# Patient Record
Sex: Female | Born: 1960 | Race: Black or African American | Hispanic: No | Marital: Married | Smoking: Never smoker
Health system: Southern US, Community
[De-identification: ages and names within clinical notes are randomized; demographics above are authoritative.]

## PROBLEM LIST (undated history)

## (undated) DIAGNOSIS — I10 Essential (primary) hypertension: Secondary | ICD-10-CM

## (undated) DIAGNOSIS — N750 Cyst of Bartholin's gland: Secondary | ICD-10-CM

## (undated) DIAGNOSIS — Z87440 Personal history of urinary (tract) infections: Secondary | ICD-10-CM

## (undated) DIAGNOSIS — D259 Leiomyoma of uterus, unspecified: Secondary | ICD-10-CM

## (undated) DIAGNOSIS — K649 Unspecified hemorrhoids: Secondary | ICD-10-CM

## (undated) DIAGNOSIS — A419 Sepsis, unspecified organism: Secondary | ICD-10-CM

## (undated) DIAGNOSIS — Z8719 Personal history of other diseases of the digestive system: Secondary | ICD-10-CM

## (undated) HISTORY — DX: Leiomyoma of uterus, unspecified: D25.9

## (undated) HISTORY — PX: DIAGNOSTIC LAPAROSCOPY: SUR761

## (undated) HISTORY — DX: Cyst of Bartholin's gland: N75.0

## (undated) HISTORY — PX: APPENDECTOMY: SHX54

## (undated) HISTORY — DX: Unspecified hemorrhoids: K64.9

## (undated) HISTORY — DX: Essential (primary) hypertension: I10

## (undated) HISTORY — DX: Sepsis, unspecified organism: A41.9

---

## 2003-08-05 HISTORY — PX: ABDOMINAL HYSTERECTOMY: SHX81

## 2011-08-09 DIAGNOSIS — IMO0001 Reserved for inherently not codable concepts without codable children: Secondary | ICD-10-CM | POA: Insufficient documentation

## 2011-08-09 DIAGNOSIS — I132 Hypertensive heart and chronic kidney disease with heart failure and with stage 5 chronic kidney disease, or end stage renal disease: Secondary | ICD-10-CM | POA: Insufficient documentation

## 2012-07-22 DIAGNOSIS — L930 Discoid lupus erythematosus: Secondary | ICD-10-CM | POA: Insufficient documentation

## 2012-07-22 DIAGNOSIS — D219 Benign neoplasm of connective and other soft tissue, unspecified: Secondary | ICD-10-CM | POA: Insufficient documentation

## 2012-07-22 DIAGNOSIS — G5600 Carpal tunnel syndrome, unspecified upper limb: Secondary | ICD-10-CM | POA: Insufficient documentation

## 2012-07-22 DIAGNOSIS — K449 Diaphragmatic hernia without obstruction or gangrene: Secondary | ICD-10-CM | POA: Insufficient documentation

## 2012-12-20 DIAGNOSIS — J302 Other seasonal allergic rhinitis: Secondary | ICD-10-CM | POA: Insufficient documentation

## 2013-10-24 ENCOUNTER — Ambulatory Visit (INDEPENDENT_AMBULATORY_CARE_PROVIDER_SITE_OTHER): Payer: BC Managed Care – PPO | Admitting: Sports Medicine

## 2013-10-24 ENCOUNTER — Encounter: Payer: Self-pay | Admitting: Sports Medicine

## 2013-10-24 VITALS — BP 156/90 | HR 63 | Ht 66.0 in | Wt 188.0 lb

## 2013-10-24 DIAGNOSIS — M2240 Chondromalacia patellae, unspecified knee: Secondary | ICD-10-CM | POA: Insufficient documentation

## 2013-10-24 DIAGNOSIS — S86899A Other injury of other muscle(s) and tendon(s) at lower leg level, unspecified leg, initial encounter: Secondary | ICD-10-CM | POA: Insufficient documentation

## 2013-10-24 DIAGNOSIS — M25562 Pain in left knee: Secondary | ICD-10-CM

## 2013-10-24 DIAGNOSIS — IMO0002 Reserved for concepts with insufficient information to code with codable children: Secondary | ICD-10-CM

## 2013-10-24 MED ORDER — MELOXICAM 15 MG PO TABS: ORAL_TABLET | ORAL | Status: DC

## 2013-10-24 NOTE — Progress Notes (Signed)
   Subjective:    I'm seeing this patient as a consultation for:  Dr. Jenny Reichmann Card  CC: Left knee pain, left shin pain  HPI: Patient is a pleasant 53 year old who presents with left knee pain since last December. She started running 6 months ago. During that she starting having shin splints in her left leg. After a 9 mile run at the end of last December she had sharp pain in the medial left knee with getting up and walking up stairs. Two weeks later she was seen and she got x-rays and was started on NSAIDs. She also went for one session of physical therapy. With no relief after a month, she got a steroid injection two weeks ago and had an MRI. The steroid injection relieved some of her pain after 4 days, but she is still unable to run or exercise. Complains of crepitus in both knees, however denies any mechanical symptoms like popping or locking. Her main issue is that no one has explained her symptoms or biomechanics or issues to her very well  Past medical history, Surgical history, Family history not pertinant except as noted below, Social history, Allergies, and medications have been entered into the medical record, reviewed, and no changes needed.   Review of Systems: No headache, visual changes, nausea, vomiting, diarrhea, constipation, dizziness, abdominal pain, skin rash, fevers, chills, night sweats, weight loss, swollen lymph nodes, body aches, joint swelling, muscle aches, chest pain, shortness of breath, mood changes, visual or auditory hallucinations.   Objective:   General: Well Developed, well nourished, and in no acute distress.  Neuro/Psych: Alert and oriented x3, extra-ocular muscles intact, able to move all 4 extremities, sensation grossly intact. Skin: Warm and dry, no rashes noted.  Respiratory: Not using accessory muscles, speaking in full sentences, trachea midline.  Cardiovascular: Pulses palpable, no extremity edema. Abdomen: Does not appear distended. Left Knee: Tender to  palpation over medial joint line. Negative anterior and posterior drawer test. Left Leg: Tender to palpation over anterior tibia. Weakness in hip abduction.  Impression and Recommendations:   This case required medical decision making of moderate complexity.

## 2013-10-24 NOTE — Assessment & Plan Note (Addendum)
Had an injection 2 weeks ago at [place], pain-free. We will work extensively on physical therapy and rehabilitation. She will bring me a disc with her x-ray and MRI report.

## 2013-10-24 NOTE — Assessment & Plan Note (Signed)
Profound left-sided hip abductor weakness. Formal physical therapy working extensively on hip abductors. Return to see me in 6 weeks.

## 2013-10-25 ENCOUNTER — Encounter: Payer: Self-pay | Admitting: Sports Medicine

## 2013-11-03 ENCOUNTER — Ambulatory Visit (INDEPENDENT_AMBULATORY_CARE_PROVIDER_SITE_OTHER): Payer: BC Managed Care – PPO

## 2013-11-03 ENCOUNTER — Ambulatory Visit: Payer: BC Managed Care – PPO

## 2013-11-03 DIAGNOSIS — M25569 Pain in unspecified knee: Secondary | ICD-10-CM

## 2013-11-03 DIAGNOSIS — IMO0002 Reserved for concepts with insufficient information to code with codable children: Secondary | ICD-10-CM

## 2013-11-03 DIAGNOSIS — M6281 Muscle weakness (generalized): Secondary | ICD-10-CM

## 2013-11-03 DIAGNOSIS — R609 Edema, unspecified: Secondary | ICD-10-CM

## 2013-11-15 ENCOUNTER — Telehealth: Payer: Self-pay | Admitting: Sports Medicine

## 2013-11-17 ENCOUNTER — Encounter (INDEPENDENT_AMBULATORY_CARE_PROVIDER_SITE_OTHER): Payer: BC Managed Care – PPO

## 2013-11-17 DIAGNOSIS — M25569 Pain in unspecified knee: Secondary | ICD-10-CM

## 2013-11-17 DIAGNOSIS — R609 Edema, unspecified: Secondary | ICD-10-CM

## 2013-11-17 DIAGNOSIS — IMO0002 Reserved for concepts with insufficient information to code with codable children: Secondary | ICD-10-CM

## 2013-11-17 DIAGNOSIS — M6281 Muscle weakness (generalized): Secondary | ICD-10-CM

## 2013-11-22 ENCOUNTER — Encounter: Payer: Self-pay | Admitting: Sports Medicine

## 2013-12-06 ENCOUNTER — Ambulatory Visit (INDEPENDENT_AMBULATORY_CARE_PROVIDER_SITE_OTHER): Payer: BC Managed Care – PPO | Admitting: Sports Medicine

## 2013-12-06 ENCOUNTER — Encounter: Payer: Self-pay | Admitting: Sports Medicine

## 2013-12-06 VITALS — BP 144/83 | HR 70 | Ht 66.0 in | Wt 188.0 lb

## 2013-12-06 DIAGNOSIS — K219 Gastro-esophageal reflux disease without esophagitis: Secondary | ICD-10-CM | POA: Insufficient documentation

## 2013-12-06 DIAGNOSIS — S86899A Other injury of other muscle(s) and tendon(s) at lower leg level, unspecified leg, initial encounter: Secondary | ICD-10-CM

## 2013-12-06 DIAGNOSIS — M25569 Pain in unspecified knee: Secondary | ICD-10-CM | POA: Insufficient documentation

## 2013-12-06 DIAGNOSIS — IMO0002 Reserved for concepts with insufficient information to code with codable children: Secondary | ICD-10-CM

## 2013-12-06 NOTE — Progress Notes (Signed)
  Subjective:    CC: Followup  HPI: Medial tibial stress syndrome: Improved significantly with hip abductor rehabilitation, she does not desire custom orthotics. Pain is mild, continues to improve.  Past medical history, Surgical history, Family history not pertinant except as noted below, Social history, Allergies, and medications have been entered into the medical record, reviewed, and no changes needed.   Review of Systems: No fevers, chills, night sweats, weight loss, chest pain, or shortness of breath.   Objective:    General: Well Developed, well nourished, and in no acute distress.  Neuro: Alert and oriented x3, extra-ocular muscles intact, sensation grossly intact.  HEENT: Normocephalic, atraumatic, pupils equal round reactive to light, neck supple, no masses, no lymphadenopathy, thyroid nonpalpable.  Skin: Warm and dry, no rashes. Cardiac: Regular rate and rhythm, no murmurs rubs or gallops, no lower extremity edema.  Respiratory: Clear to auscultation bilaterally. Not using accessory muscles, speaking in full sentences. Left Hip abductor strength is improved but still not sufficient.  I taught her some hip abductor rehabilitation exercises in the office. She was given some green theraband.  Impression and Recommendations:

## 2013-12-06 NOTE — Patient Instructions (Signed)
Hip Rehabilitation Protocol:  1.  Side leg raises.  3x30 with no weight, then 3x15 with 2 lb ankle weight, then 3x15 with 5 lb ankle weight 

## 2013-12-06 NOTE — Assessment & Plan Note (Signed)
Improved significantly, continue to work on hip abductor's, they were better but not great. Return for custom orthotics.

## 2013-12-15 ENCOUNTER — Ambulatory Visit (INDEPENDENT_AMBULATORY_CARE_PROVIDER_SITE_OTHER): Payer: BC Managed Care – PPO | Admitting: Sports Medicine

## 2013-12-15 ENCOUNTER — Encounter: Payer: Self-pay | Admitting: Sports Medicine

## 2013-12-15 VITALS — BP 147/83 | HR 73 | Ht 66.0 in | Wt 192.0 lb

## 2013-12-15 DIAGNOSIS — IMO0002 Reserved for concepts with insufficient information to code with codable children: Secondary | ICD-10-CM

## 2013-12-15 DIAGNOSIS — S86899A Other injury of other muscle(s) and tendon(s) at lower leg level, unspecified leg, initial encounter: Secondary | ICD-10-CM

## 2013-12-15 NOTE — Assessment & Plan Note (Signed)
Custom orthotics as above. Continue hip abductor rehabilitation, return in one month.

## 2013-12-15 NOTE — Progress Notes (Signed)
    Patient was fitted for a : standard, cushioned, semi-rigid orthotic. The orthotic was heated and afterward the patient stood on the orthotic blank positioned on the orthotic stand. The patient was positioned in subtalar neutral position and 10 degrees of ankle dorsiflexion in a weight bearing stance. After completion of molding, a stable base was applied to the orthotic blank. The blank was ground to a stable position for weight bearing. Size: 9 Base: Blue EVA Additional Posting and Padding: None The patient ambulated these, and they were very comfortable.  I spent 40 minutes with this patient, greater than 50% was face-to-face time counseling regarding the below diagnosis.

## 2014-01-16 ENCOUNTER — Ambulatory Visit (INDEPENDENT_AMBULATORY_CARE_PROVIDER_SITE_OTHER): Payer: BC Managed Care – PPO | Admitting: Sports Medicine

## 2014-01-16 ENCOUNTER — Ambulatory Visit: Payer: BC Managed Care – PPO | Admitting: Family Medicine

## 2014-01-16 ENCOUNTER — Encounter: Payer: Self-pay | Admitting: Sports Medicine

## 2014-01-16 VITALS — BP 153/88 | HR 86 | Ht 66.0 in | Wt 190.0 lb

## 2014-01-16 DIAGNOSIS — M5412 Radiculopathy, cervical region: Secondary | ICD-10-CM | POA: Insufficient documentation

## 2014-01-16 DIAGNOSIS — S86899A Other injury of other muscle(s) and tendon(s) at lower leg level, unspecified leg, initial encounter: Secondary | ICD-10-CM

## 2014-01-16 DIAGNOSIS — M25562 Pain in left knee: Secondary | ICD-10-CM

## 2014-01-16 MED ORDER — METAXALONE 800 MG PO TABS: 800.0000 mg | ORAL_TABLET | Freq: Three times a day (TID) | ORAL | Status: DC

## 2014-01-16 MED ORDER — MELOXICAM 15 MG PO TABS: ORAL_TABLET | ORAL | Status: DC

## 2014-01-16 NOTE — Progress Notes (Signed)
  Subjective:    CC: Follow up  HPI: This is a pleasant 53 year-old female, treated her for medial tibial stress syndrome and left knee pain with custom orthotics. Her pain is now resolved. Unfortunately she now has pain she localizes in the cervical spine with radiation down the right arm in a C7 distribution. Moderate, persistent.  Past medical history, Surgical history, Family history not pertinant except as noted below, Social history, Allergies, and medications have been entered into the medical record, reviewed, and no changes needed.   Review of Systems: No fevers, chills, night sweats, weight loss, chest pain, or shortness of breath.   Objective:    General: Well Developed, well nourished, and in no acute distress.  Neuro: Alert and oriented x3, extra-ocular muscles intact, sensation grossly intact.  HEENT: Normocephalic, atraumatic, pupils equal round reactive to light, neck supple, no masses, no lymphadenopathy, thyroid nonpalpable.  Skin: Warm and dry, no rashes. Cardiac: Regular rate and rhythm, no murmurs rubs or gallops, no lower extremity edema.  Respiratory: Clear to auscultation bilaterally. Not using accessory muscles, speaking in full sentences. Neck: Inspection unremarkable. No palpable stepoffs. Negative Spurling's maneuver. Full neck range of motion Grip strength and sensation normal in bilateral hands Strength good C4 to T1 distribution No sensory change to C4 to T1 Negative Hoffman sign bilaterally Reflexes normal  Impression and Recommendations:

## 2014-01-16 NOTE — Assessment & Plan Note (Signed)
Resolved with custom orthotics 

## 2014-01-16 NOTE — Assessment & Plan Note (Signed)
Right-sided, likely C7, prescribed prednisone by another provider. At this point we are going to proceed with an x-ray, formal PT, Skelaxin, Mobic. Return to see me in a month, MRI for interventional ejection planning if no better.

## 2014-01-16 NOTE — Assessment & Plan Note (Signed)
She is having some pain on the lateral side of her right knee with running suggestive of iliotibial band syndrome. Home rehabilitation exercises, return as needed.

## 2014-01-24 ENCOUNTER — Ambulatory Visit (INDEPENDENT_AMBULATORY_CARE_PROVIDER_SITE_OTHER): Payer: BC Managed Care – PPO | Admitting: Physical Therapy

## 2014-01-24 DIAGNOSIS — M6281 Muscle weakness (generalized): Secondary | ICD-10-CM

## 2014-01-24 DIAGNOSIS — M25519 Pain in unspecified shoulder: Secondary | ICD-10-CM

## 2014-01-24 DIAGNOSIS — M5412 Radiculopathy, cervical region: Secondary | ICD-10-CM

## 2014-01-24 DIAGNOSIS — M542 Cervicalgia: Secondary | ICD-10-CM

## 2014-01-30 ENCOUNTER — Encounter (INDEPENDENT_AMBULATORY_CARE_PROVIDER_SITE_OTHER): Payer: BC Managed Care – PPO | Admitting: Physical Therapy

## 2014-01-30 DIAGNOSIS — M6281 Muscle weakness (generalized): Secondary | ICD-10-CM

## 2014-01-30 DIAGNOSIS — M25519 Pain in unspecified shoulder: Secondary | ICD-10-CM

## 2014-01-30 DIAGNOSIS — M542 Cervicalgia: Secondary | ICD-10-CM

## 2014-01-30 DIAGNOSIS — M5412 Radiculopathy, cervical region: Secondary | ICD-10-CM

## 2014-02-01 ENCOUNTER — Encounter (INDEPENDENT_AMBULATORY_CARE_PROVIDER_SITE_OTHER): Payer: BC Managed Care – PPO | Admitting: Physical Therapy

## 2014-02-01 DIAGNOSIS — M25519 Pain in unspecified shoulder: Secondary | ICD-10-CM

## 2014-02-01 DIAGNOSIS — M6281 Muscle weakness (generalized): Secondary | ICD-10-CM

## 2014-02-01 DIAGNOSIS — M542 Cervicalgia: Secondary | ICD-10-CM

## 2014-02-01 DIAGNOSIS — M5412 Radiculopathy, cervical region: Secondary | ICD-10-CM

## 2014-02-02 ENCOUNTER — Encounter: Payer: BC Managed Care – PPO | Admitting: Physical Therapy

## 2014-02-06 ENCOUNTER — Encounter (INDEPENDENT_AMBULATORY_CARE_PROVIDER_SITE_OTHER): Payer: BC Managed Care – PPO | Admitting: Physical Therapy

## 2014-02-06 DIAGNOSIS — M5412 Radiculopathy, cervical region: Secondary | ICD-10-CM

## 2014-02-06 DIAGNOSIS — M542 Cervicalgia: Secondary | ICD-10-CM

## 2014-02-06 DIAGNOSIS — M6281 Muscle weakness (generalized): Secondary | ICD-10-CM

## 2014-02-06 DIAGNOSIS — M25519 Pain in unspecified shoulder: Secondary | ICD-10-CM

## 2014-02-09 ENCOUNTER — Encounter: Payer: BC Managed Care – PPO | Admitting: Physical Therapy

## 2014-02-13 ENCOUNTER — Ambulatory Visit (INDEPENDENT_AMBULATORY_CARE_PROVIDER_SITE_OTHER): Payer: BC Managed Care – PPO

## 2014-02-13 ENCOUNTER — Ambulatory Visit (INDEPENDENT_AMBULATORY_CARE_PROVIDER_SITE_OTHER): Payer: BC Managed Care – PPO | Admitting: Sports Medicine

## 2014-02-13 ENCOUNTER — Telehealth: Payer: Self-pay | Admitting: *Deleted

## 2014-02-13 ENCOUNTER — Encounter: Payer: Self-pay | Admitting: Sports Medicine

## 2014-02-13 VITALS — BP 148/83 | HR 63 | Wt 191.0 lb

## 2014-02-13 DIAGNOSIS — M5030 Other cervical disc degeneration, unspecified cervical region: Secondary | ICD-10-CM

## 2014-02-13 DIAGNOSIS — M5412 Radiculopathy, cervical region: Secondary | ICD-10-CM

## 2014-02-13 DIAGNOSIS — M47812 Spondylosis without myelopathy or radiculopathy, cervical region: Secondary | ICD-10-CM

## 2014-02-13 DIAGNOSIS — M4040 Postural lordosis, site unspecified: Secondary | ICD-10-CM

## 2014-02-13 NOTE — Progress Notes (Signed)
  Subjective:    CC: Followup  HPI: This is a pleasant 53 year old female who returns to see me for right-sided cervical radiculopathy, she describes it as radiating down the back side of her arm, to the thumb and forefinger. It is worse when turning her head to the ipsilateral side. Pain is moderate, persistent and significantly not much better after physical therapy, steroids, NSAIDs, and muscle relaxers.  Past medical history, Surgical history, Family history not pertinant except as noted below, Social history, Allergies, and medications have been entered into the medical record, reviewed, and no changes needed.   Review of Systems: No fevers, chills, night sweats, weight loss, chest pain, or shortness of breath.   Objective:    General: Well Developed, well nourished, and in no acute distress.  Neuro: Alert and oriented x3, extra-ocular muscles intact, sensation grossly intact.  HEENT: Normocephalic, atraumatic, pupils equal round reactive to light, neck supple, no masses, no lymphadenopathy, thyroid nonpalpable.  Skin: Warm and dry, no rashes. Cardiac: Regular rate and rhythm, no murmurs rubs or gallops, no lower extremity edema.  Respiratory: Clear to auscultation bilaterally. Not using accessory muscles, speaking in full sentences.  X-rays do show expected C5-C6 degenerative changes.  Impression and Recommendations:

## 2014-02-13 NOTE — Assessment & Plan Note (Signed)
Only slightly improved after physical therapy, steroids, muscle relaxers, and NSAIDs for 6 weeks. MRI cervical spine. This likely represents a C6 versus C7 radiculopathy on the right side. Epidural is the next. Return to see me to go over MRI results

## 2014-02-13 NOTE — Telephone Encounter (Signed)
MRI cervical 17915 approved 02/13/14-03/14/14. Auth [id number] Denman George). Radiology notified. Margette Fast, CMA

## 2014-02-14 ENCOUNTER — Encounter (INDEPENDENT_AMBULATORY_CARE_PROVIDER_SITE_OTHER): Payer: Self-pay

## 2014-02-14 ENCOUNTER — Ambulatory Visit (HOSPITAL_BASED_OUTPATIENT_CLINIC_OR_DEPARTMENT_OTHER)
Admission: RE | Admit: 2014-02-14 | Discharge: 2014-02-14 | Disposition: A | Discharge status: Home / Self Care | Payer: BC Managed Care – PPO | Source: Ambulatory Visit | Attending: Sports Medicine | Admitting: Sports Medicine

## 2014-02-14 DIAGNOSIS — M542 Cervicalgia: Secondary | ICD-10-CM | POA: Insufficient documentation

## 2014-02-14 DIAGNOSIS — M47812 Spondylosis without myelopathy or radiculopathy, cervical region: Secondary | ICD-10-CM | POA: Insufficient documentation

## 2014-02-14 DIAGNOSIS — M5030 Other cervical disc degeneration, unspecified cervical region: Secondary | ICD-10-CM | POA: Insufficient documentation

## 2014-02-14 DIAGNOSIS — M5412 Radiculopathy, cervical region: Secondary | ICD-10-CM

## 2014-02-14 DIAGNOSIS — R209 Unspecified disturbances of skin sensation: Secondary | ICD-10-CM | POA: Insufficient documentation

## 2014-02-14 DIAGNOSIS — M79609 Pain in unspecified limb: Secondary | ICD-10-CM | POA: Insufficient documentation

## 2014-02-17 ENCOUNTER — Encounter: Payer: Self-pay | Admitting: Sports Medicine

## 2014-02-17 ENCOUNTER — Ambulatory Visit (INDEPENDENT_AMBULATORY_CARE_PROVIDER_SITE_OTHER): Payer: BC Managed Care – PPO | Admitting: Sports Medicine

## 2014-02-17 VITALS — BP 151/85 | HR 67 | Wt 191.0 lb

## 2014-02-17 DIAGNOSIS — M5412 Radiculopathy, cervical region: Secondary | ICD-10-CM | POA: Diagnosis not present

## 2014-02-17 NOTE — Progress Notes (Signed)
  Subjective:    CC: Followup  HPI: Cervical radiculitis: Right-sided C6 and C7, MRI results will be dictated below, she has failed physical therapy, steroids, NSAIDs, muscle relaxers. Pain is moderate, persistent.  Past medical history, Surgical history, Family history not pertinant except as noted below, Social history, Allergies, and medications have been entered into the medical record, reviewed, and no changes needed.   Review of Systems: No fevers, chills, night sweats, weight loss, chest pain, or shortness of breath.   Objective:    General: Well Developed, well nourished, and in no acute distress.  Neuro: Alert and oriented x3, extra-ocular muscles intact, sensation grossly intact.  HEENT: Normocephalic, atraumatic, pupils equal round reactive to light, neck supple, no masses, no lymphadenopathy, thyroid nonpalpable.  Skin: Warm and dry, no rashes. Cardiac: Regular rate and rhythm, no murmurs rubs or gallops, no lower extremity edema.  Respiratory: Clear to auscultation bilaterally. Not using accessory muscles, speaking in full sentences.   MRI shows C4-5 and C5-C6 degenerative disc disease with elements of right-sided foraminal stenosis at both levels.  Impression and Recommendations:

## 2014-02-17 NOTE — Assessment & Plan Note (Signed)
Persistent right-sided C6 versus C7 radicular symptoms with multilevel cervical degenerative disc disease worse at the C4-C5 and C5-C6 levels. At this point we are going to proceed with a right-sided C6-C7 interlaminar epidural injection with medicine injected up to the C4-C5 level ideally. Return to see me 2 weeks after the epidural to evaluate response.

## 2014-02-24 ENCOUNTER — Other Ambulatory Visit: Payer: BC Managed Care – PPO

## 2014-02-27 ENCOUNTER — Ambulatory Visit
Admission: RE | Admit: 2014-02-27 | Discharge: 2014-02-27 | Disposition: A | Discharge status: Home / Self Care | Payer: BC Managed Care – PPO | Source: Ambulatory Visit | Attending: Sports Medicine | Admitting: Sports Medicine

## 2014-02-27 VITALS — BP 153/75 | HR 52

## 2014-02-27 DIAGNOSIS — M5020 Other cervical disc displacement, unspecified cervical region: Secondary | ICD-10-CM

## 2014-02-27 MED ORDER — TRIAMCINOLONE ACETONIDE 40 MG/ML IJ SUSP (RADIOLOGY)
60.0000 mg | Freq: Once | INTRAMUSCULAR | Status: AC
  Administered 2014-02-27: 60 mg via EPIDURAL

## 2014-02-27 MED ORDER — IOHEXOL 300 MG/ML  SOLN
1.0000 mL | Freq: Once | INTRAMUSCULAR | Status: AC | PRN
  Administered 2014-02-27: 1 mL via EPIDURAL

## 2014-02-27 NOTE — Discharge Instructions (Signed)

## 2014-03-07 ENCOUNTER — Ambulatory Visit: Payer: BC Managed Care – PPO | Admitting: Sports Medicine

## 2014-03-14 ENCOUNTER — Ambulatory Visit (INDEPENDENT_AMBULATORY_CARE_PROVIDER_SITE_OTHER): Payer: BC Managed Care – PPO | Admitting: Sports Medicine

## 2014-03-14 ENCOUNTER — Encounter: Payer: Self-pay | Admitting: Sports Medicine

## 2014-03-14 VITALS — BP 154/85 | HR 71 | Ht 66.0 in | Wt 187.0 lb

## 2014-03-14 DIAGNOSIS — M5412 Radiculopathy, cervical region: Secondary | ICD-10-CM

## 2014-03-14 NOTE — Assessment & Plan Note (Signed)
Excellent response with 70% improvement in radicular symptoms in 100% improvement in neck pain after right-sided C7-T1 interlaminar epidural. Repeat epidural, if possible this time at the C6-C7 level.

## 2014-03-14 NOTE — Progress Notes (Signed)
  Subjective:    CC: Followup after epidural  HPI: This is a pleasant 53 year old female, she is post epidural for right-sided C7 radiculitis, she tells me that after the epidural she had near complete pain relief in her neck, and 70% improvement in her right arm radicular symptoms. She is happy with the results and does desire to proceed with a second in the series of epidurals.  Past medical history, Surgical history, Family history not pertinant except as noted below, Social history, Allergies, and medications have been entered into the medical record, reviewed, and no changes needed.   Review of Systems: No fevers, chills, night sweats, weight loss, chest pain, or shortness of breath.   Objective:    General: Well Developed, well nourished, and in no acute distress.  Neuro: Alert and oriented x3, extra-ocular muscles intact, sensation grossly intact.  HEENT: Normocephalic, atraumatic, pupils equal round reactive to light, neck supple, no masses, no lymphadenopathy, thyroid nonpalpable.  Skin: Warm and dry, no rashes. Cardiac: Regular rate and rhythm, no murmurs rubs or gallops, no lower extremity edema.  Respiratory: Clear to auscultation bilaterally. Not using accessory muscles, speaking in full sentences. Neck: Negative spurling's Full neck range of motion Grip strength and sensation normal in bilateral hands Strength good C4 to T1 distribution No sensory change to C4 to T1 Reflexes normal  Impression and Recommendations:

## 2014-03-23 ENCOUNTER — Ambulatory Visit
Admission: RE | Admit: 2014-03-23 | Discharge: 2014-03-23 | Disposition: A | Discharge status: Home / Self Care | Payer: BC Managed Care – PPO | Source: Ambulatory Visit | Attending: Sports Medicine | Admitting: Sports Medicine

## 2014-03-23 VITALS — BP 145/84 | HR 64

## 2014-03-23 DIAGNOSIS — M5020 Other cervical disc displacement, unspecified cervical region: Secondary | ICD-10-CM

## 2014-03-23 MED ORDER — IOHEXOL 300 MG/ML  SOLN
1.0000 mL | Freq: Once | INTRAMUSCULAR | Status: AC | PRN
  Administered 2014-03-23: 1 mL via EPIDURAL

## 2014-03-23 MED ORDER — TRIAMCINOLONE ACETONIDE 40 MG/ML IJ SUSP (RADIOLOGY)
60.0000 mg | Freq: Once | INTRAMUSCULAR | Status: AC
  Administered 2014-03-23: 60 mg via EPIDURAL

## 2014-03-23 NOTE — Discharge Instructions (Signed)

## 2014-04-11 ENCOUNTER — Ambulatory Visit: Payer: BC Managed Care – PPO | Admitting: Sports Medicine

## 2014-04-11 DIAGNOSIS — Z0289 Encounter for other administrative examinations: Secondary | ICD-10-CM

## 2014-06-28 ENCOUNTER — Ambulatory Visit: Payer: BC Managed Care – PPO | Admitting: Sports Medicine

## 2014-07-06 ENCOUNTER — Ambulatory Visit: Payer: BC Managed Care – PPO | Admitting: Family Medicine

## 2014-12-12 ENCOUNTER — Ambulatory Visit (INDEPENDENT_AMBULATORY_CARE_PROVIDER_SITE_OTHER): Payer: BC Managed Care – PPO

## 2014-12-12 ENCOUNTER — Ambulatory Visit (INDEPENDENT_AMBULATORY_CARE_PROVIDER_SITE_OTHER): Payer: BC Managed Care – PPO | Admitting: Sports Medicine

## 2014-12-12 DIAGNOSIS — M2240 Chondromalacia patellae, unspecified knee: Secondary | ICD-10-CM

## 2014-12-12 DIAGNOSIS — M25562 Pain in left knee: Secondary | ICD-10-CM | POA: Diagnosis not present

## 2014-12-12 DIAGNOSIS — M25561 Pain in right knee: Secondary | ICD-10-CM | POA: Diagnosis not present

## 2014-12-12 MED ORDER — MELOXICAM 15 MG PO TABS: ORAL_TABLET | ORAL | Status: DC

## 2014-12-12 NOTE — Assessment & Plan Note (Signed)
Interventional treatment provided today per patient request. We are going to restart meloxicam and have her do some formal physical therapy. I am also going to get an updated set of bilateral x-rays. Return in 6 weeks.

## 2014-12-12 NOTE — Progress Notes (Signed)
  Subjective:    CC: Bilateral knee pain  HPI: This is a pleasant 54 year old female, she has bilateral anterior knee pain, moderate, persistent, localized under the kneecaps and worse with going up and down stairs as well as with squatting. No mechanical symptoms, minimal swelling. No trauma.  Past medical history, Surgical history, Family history not pertinant except as noted below, Social history, Allergies, and medications have been entered into the medical record, reviewed, and no changes needed.   Review of Systems: No fevers, chills, night sweats, weight loss, chest pain, or shortness of breath.   Objective:    General: Well Developed, well nourished, and in no acute distress.  Neuro: Alert and oriented x3, extra-ocular muscles intact, sensation grossly intact.  HEENT: Normocephalic, atraumatic, pupils equal round reactive to light, neck supple, no masses, no lymphadenopathy, thyroid nonpalpable.  Skin: Warm and dry, no rashes. Cardiac: Regular rate and rhythm, no murmurs rubs or gallops, no lower extremity edema.  Respiratory: Clear to auscultation bilaterally. Not using accessory muscles, speaking in full sentences. Bilateral Knee: Tender to palpation of the medial and lateral patellar facets. ROM normal in flexion and extension and lower leg rotation. Ligaments with solid consistent endpoints including ACL, PCL, LCL, MCL. Negative Mcmurray's and provocative meniscal tests. Non painful patellar compression. Patellar and quadriceps tendons unremarkable. Hamstring and quadriceps strength is normal.  Procedure: Real-time Ultrasound Guided Injection of left knee Device: GE Logiq E  Verbal informed consent obtained.  Time-out conducted.  Noted no overlying erythema, induration, or other signs of local infection.  Skin prepped in a sterile fashion.  Local anesthesia: Topical Ethyl chloride.  With sterile technique and under real time ultrasound guidance:  1 mL kenalog 40, 4 mL  lidocaine injected easily. Completed without difficulty  Pain immediately resolved suggesting accurate placement of the medication.  Advised to call if fevers/chills, erythema, induration, drainage, or persistent bleeding.  Images permanently stored and available for review in the ultrasound unit.  Impression: Technically successful ultrasound guided injection.  Procedure: Real-time Ultrasound Guided Injection of right knee Device: GE Logiq E  Verbal informed consent obtained.  Time-out conducted.  Noted no overlying erythema, induration, or other signs of local infection.  Skin prepped in a sterile fashion.  Local anesthesia: Topical Ethyl chloride.  With sterile technique and under real time ultrasound guidance:  1 mL kenalog 40, 4 mL lidocaine injected easily. Completed without difficulty  Pain immediately resolved suggesting accurate placement of the medication.  Advised to call if fevers/chills, erythema, induration, drainage, or persistent bleeding.  Images permanently stored and available for review in the ultrasound unit.  Impression: Technically successful ultrasound guided injection.  Impression and Recommendations:

## 2015-01-16 ENCOUNTER — Ambulatory Visit: Payer: BC Managed Care – PPO | Admitting: Family Medicine

## 2015-01-16 ENCOUNTER — Encounter: Payer: Self-pay | Admitting: Sports Medicine

## 2015-01-16 ENCOUNTER — Ambulatory Visit (INDEPENDENT_AMBULATORY_CARE_PROVIDER_SITE_OTHER): Payer: BC Managed Care – PPO | Admitting: Sports Medicine

## 2015-01-16 VITALS — BP 101/58 | HR 80 | Ht 66.0 in | Wt 165.0 lb

## 2015-01-16 DIAGNOSIS — I10 Essential (primary) hypertension: Secondary | ICD-10-CM | POA: Insufficient documentation

## 2015-01-16 MED ORDER — MAGNESIUM OXIDE 400 MG PO TABS: 800.0000 mg | ORAL_TABLET | Freq: Every day | ORAL | Status: AC

## 2015-01-16 MED ORDER — LISINOPRIL 20 MG PO TABS: 10.0000 mg | ORAL_TABLET | Freq: Every day | ORAL | Status: DC

## 2015-01-16 MED ORDER — TRAMADOL HCL 50 MG PO TABS: ORAL_TABLET | ORAL | Status: DC

## 2015-01-16 NOTE — Assessment & Plan Note (Signed)
There have not been 2 episodes of body cramping, pain, and early fatigability after running and biking. She was somewhat hypokalemic in the emergency department with a normal CK level, normal renal function. We are going to discontinue her chlorthalidone, diuretics should be used sparingly and athletes anyway. I'm going to switch her to lisinopril. Rechecking electrolytes, CK level, adding tramadol and magnesium to use at bedtime for now.

## 2015-01-16 NOTE — Progress Notes (Signed)
  Subjective:    CC: Muscle cramps  HPI: This is a pleasant 54 year old female, she is a runner, she also likes to ride her bike. This weekend after a fairly long bike ride she had severe muscle aches, cramping, she went to the emergency department where her potassium was noted mildly low, she was hydrated, given potassium, but unfortunately continued to have muscle pain and cramping. On review of her records from [facility] electrolyte are completely normal with the exception of a potassium of 3.1, CK levels were normal. magnesium levels were not checked.  she does take chlorthalidone and potassium for hypertension.   Past medical history, Surgical history, Family history not pertinant except as noted below, Social history, Allergies, and medications have been entered into the medical record, reviewed, and no changes needed.   Review of Systems: No fevers, chills, night sweats, weight loss, chest pain, or shortness of breath.   Objective:    General: Well Developed, well nourished, and in no acute distress.  Neuro: Alert and oriented x3, extra-ocular muscles intact, sensation grossly intact.  HEENT: Normocephalic, atraumatic, pupils equal round reactive to light, neck supple, no masses, no lymphadenopathy, thyroid nonpalpable.  Skin: Warm and dry, no rashes. Cardiac: Regular rate and rhythm, no murmurs rubs or gallops, no lower extremity edema.  Respiratory: Clear to auscultation bilaterally. Not using accessory muscles, speaking in full sentences. Extremities: Calves, eyes, upper and lower arms were all minimally tender to palpation. Good pulses.  Impression and Recommendations:

## 2015-01-17 LAB — COMPREHENSIVE METABOLIC PANEL
Albumin: 3.6 g/dL (ref 3.5–5.2)
Alkaline Phosphatase: 43 U/L (ref 39–117)
BUN: 13 mg/dL (ref 6–23)
CO2: 29 mEq/L (ref 19–32)
Calcium: 8.8 mg/dL (ref 8.4–10.5)
Creat: 0.75 mg/dL (ref 0.50–1.10)
Glucose, Bld: 69 mg/dL — ABNORMAL LOW (ref 70–99)
Potassium: 4.4 mEq/L (ref 3.5–5.3)
Total Protein: 6.7 g/dL (ref 6.0–8.3)

## 2015-01-17 LAB — COMPREHENSIVE METABOLIC PANEL WITH GFR
ALT: 12 U/L (ref 0–35)
AST: 16 U/L (ref 0–37)
Chloride: 104 meq/L (ref 96–112)
Sodium: 140 meq/L (ref 135–145)
Total Bilirubin: 0.6 mg/dL (ref 0.2–1.2)

## 2015-01-17 LAB — MAGNESIUM: Magnesium: 1.7 mg/dL (ref 1.5–2.5)

## 2015-01-17 LAB — CK: Total CK: 96 U/L (ref 7–177)

## 2015-01-18 ENCOUNTER — Telehealth: Payer: Self-pay

## 2015-01-18 NOTE — Telephone Encounter (Signed)
Patient called wanted to know if she since she is feeling better can she run in a race on this Saturday? Patient also wants to know that if she do start to cramp any during that time, is it normal? Please advise on what to tell the patient. Amy Moyer,CMA

## 2015-01-19 MED ORDER — ACETAMINOPHEN ER 650 MG PO TBCR: 1300.0000 mg | EXTENDED_RELEASE_TABLET | Freq: Three times a day (TID) | ORAL | Status: DC | PRN

## 2015-01-19 NOTE — Telephone Encounter (Signed)
Spoke to patient gave her instructions as noted below. Patient stated that the new blood pressure medication is controlling her blood pressure but makes her sleepy. Patient stated that she has already signed up for the race so she will just walk it. Patient also requested something than Tramadol for her pain she stated that it does not do nothing for her pain. Rhonda Cunningham,CMA

## 2015-01-19 NOTE — Telephone Encounter (Signed)
I think that it is a bit early, give it at least a couple of weeks. She can walk, but it is so early that she may cramp. Has she done well with the new blood pressure medicine?

## 2015-01-19 NOTE — Telephone Encounter (Signed)
That is exactly why didn't want her doing the races that she would increase her pain. Have her give it time, her renal function is good so she can continue to do the meloxicam once a day, she can add 650 mg of Tylenol 3 times per day. No narcotics. Keep hydrated.

## 2015-01-22 NOTE — Telephone Encounter (Signed)
Spoke to patient gave her instructions as noted below. Keyleigh Manninen,CMA

## 2015-01-23 ENCOUNTER — Encounter: Payer: Self-pay | Admitting: Physical Therapy

## 2015-01-23 ENCOUNTER — Ambulatory Visit (INDEPENDENT_AMBULATORY_CARE_PROVIDER_SITE_OTHER): Payer: BC Managed Care – PPO | Admitting: Physical Therapy

## 2015-01-23 ENCOUNTER — Ambulatory Visit: Payer: BC Managed Care – PPO | Admitting: Sports Medicine

## 2015-01-23 DIAGNOSIS — M25562 Pain in left knee: Secondary | ICD-10-CM | POA: Diagnosis not present

## 2015-01-23 DIAGNOSIS — R531 Weakness: Secondary | ICD-10-CM

## 2015-01-23 DIAGNOSIS — M25561 Pain in right knee: Secondary | ICD-10-CM | POA: Diagnosis not present

## 2015-01-23 NOTE — Therapy (Signed)
[place], [place], [address] Phone: [phone]   Fax:  [phone]  Physical Therapy Evaluation  Patient Details  Name: Amy Moyer MRN: [id number] Date of Birth: [birth year] Referring Provider:  Silverio Decamp,*  Encounter Date: 01/23/2015      PT End of Session - 01/23/15 1228    Visit Number 1   Number of Visits 6   Date for PT Re-Evaluation 03/06/15   PT Start Time 1150   PT Stop Time 1238   PT Time Calculation (min) 48 min   Activity Tolerance Patient tolerated treatment well      History reviewed. No pertinent past medical history.  Past Surgical History  Procedure Laterality Date  . Abdominal hysterectomy  2005    There were no vitals filed for this visit.  Visit Diagnosis:  Arthralgia of both knees - Plan: PT plan of care cert/re-cert  Weakness generalized - Plan: PT plan of care cert/re-cert      Subjective Assessment - 01/23/15 1153    Subjective Pt reports bilateral knee pain due to running for so long. Has been wearing orthotics   Pertinent History pt tries to run everyday 3-4 miles and sometimes more. She reports that once her knees are warmed up she does well. Ran 13 miles Saturday . HAd injections 8-10 months ago and this helped   How long can you stand comfortably? not limited   How long can you walk comfortably? able to walk as far as she wants, pushes through the pain.  Sometimes has to take stairs down one at a time depending on pain.    Diagnostic tests x-rays show arthritis   Patient Stated Goals wishes to be pain and surgery free   Currently in Pain? Yes   Pain Score 3    Pain Location Knee   Pain Orientation Left;Right   Pain Descriptors / Indicators Dull;Aching   Pain Type Chronic pain   Pain Radiating Towards Rt knee proximal/quad tendon, Lt more general    Pain Frequency Constant            [facility] PT Assessment - 01/23/15 0001    Assessment    Medical Diagnosis chondromalacia bilat knees   Onset Date/Surgical Date 11/23/14   Next MD Visit 02/01/15   Precautions   Precautions --  MD suggests she stops running   Required Braces or Orthoses --  foot orthotics   Balance Screen   Has the patient fallen in the past 6 months Yes   How many times? 1  fell on bike while using the clip ins   Has the patient had a decrease in activity level because of a fear of falling?  No   Is the patient reluctant to leave their home because of a fear of falling?  No   Prior Function   Level of Independence Independent   Vocation Full time employment   Pharmacist, hospital, out for the summer   Leisure run, bike, camp    Observation/Other Assessments   Focus on Therapeutic Outcomes (FOTO)  40% limited   ROM / Strength   AROM / PROM / Strength AROM;Strength   AROM   Overall AROM Comments WNL in LE's some lateral tracking of bilat patella    Strength   Strength Assessment Site Hip;Knee;Ankle   Right/Left Hip Right;Left   Right Hip Flexion 5/5   Right Hip Extension 5/5   Left Hip Flexion 5/5   Left Hip  Extension 5/5   Left Hip ABduction --  5-/5   Right/Left Knee --  bilat knee flex WNL, exten 4+/5 bilat   Right/Left Ankle --  Rt WNL, Lt grossly 5-/5   Flexibility   Soft Tissue Assessment /Muscle Length --  prone quad tight Rt 8" from buttocks Lt 6"  and tight bilat    Balance   Balance Assessed --  SLS WNL bilat                   [facility] Adult PT Treatment/Exercise - 01/23/15 0001    Exercises   Exercises Knee/Hip   Knee/Hip Exercises: Stretches   Quad Stretch 2 reps  45 sec, prone with strap   ITB Stretch 2 reps  45 sec supine cross body with strap   Knee/Hip Exercises: Standing   SLS with FWD leans   Knee/Hip Exercises: Supine   Straight Leg Raise with External Rotation Both;Strengthening;3 sets;15 reps                PT Education - 01/23/15 1227    Education provided Yes   Education  Details HEP   Person(s) Educated Patient   Methods Explanation;Demonstration   Comprehension Returned demonstration             PT Long Term Goals - 01/23/15 1239    PT LONG TERM GOAL #1   Title I with advanced HEP ( 03/06/15)   Time 6   Period Weeks   Status New   PT LONG TERM GOAL #2   Title increase quad flexibility to allow heel to reach buttocks prone bilat ( 03/06/15)   Time 6   Period Weeks   Status New   PT LONG TERM GOAL #3   Title run without knee pain ( 03/06/15)   Time 6   Period Weeks   Status New   PT LONG TERM GOAL #4   Title improve FOTO =/< 32% limited ( 03/06/15)   Time 6   Period Weeks   Status New               Plan - 01/23/15 1231    Clinical Impression Statement 54 y/o female, highly motiviated to continue her activities.  She has high level weakness with functional weightbearing tasks along with some LE tightness.  Working on these areas will allow her to improve and continue with sports with decreased pain   Pt will benefit from skilled therapeutic intervention in order to improve on the following deficits Decreased strength;Pain;Impaired flexibility   Rehab Potential Excellent   PT Frequency 1x / week   PT Duration 6 weeks   PT Treatment/Interventions Cryotherapy;Electrical Stimulation;Ultrasound;Moist Heat;Therapeutic exercise;Balance training;Neuromuscular re-education;Patient/family education;Vasopneumatic Device;Taping   PT Next Visit Plan add in core, planks prone and side. High level exercise   Consulted and Agree with Plan of Care Patient         Problem List Patient Active Problem List   Diagnosis Date Noted  . Essential hypertension, benign 01/16/2015  . Cervical radiculopathy 01/16/2014  . Gonalgia 12/06/2013  . Acid reflux 12/06/2013  . Chondromalacia of patellofemoral joint 10/24/2013  . Medial tibial stress syndrome 10/24/2013  . Allergic rhinitis, seasonal 12/20/2012  . Carpal tunnel syndrome 07/22/2012  . Discoid  lupus 07/22/2012  . Fibroid 07/22/2012  . 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

## 2015-01-23 NOTE — Patient Instructions (Signed)
KNEE: Quadriceps - Prone  [place] [phone]   Place strap around ankle. Bring ankle toward buttocks. Press hip into surface. Hold _45__ seconds. _2__ reps per set, _1__ sets per day, _7__ days per week Repeat on the other side.  Outer Hip Stretch: Reclined IT Band Stretch (Strap)   Strap around opposite foot, pull across only as far as possible with shoulders on mat. Hold for _45___secs. Repeat _2___ times each leg.  Straight Leg Raise: With External Leg Rotation   Lie on back with right leg straight, opposite leg bent. Rotate straight leg out and lift _8-10___ inches. Repeat __15__ times per set. Do _3___ sets per session. Do __1__ sessions per day.    Balance: Unilateral - Forward Lean   Stand on left foot, hands on hips. Keeping hips level, bend forward as if to touch forehead to wall. Hold _1-2___ seconds. Relax. Repeat _10___ times per set. Do __2__ sets per session. Do __1__ sessions per day.  http://orth.exer.us/88   Copyright  VHI. All rights reserved.

## 2015-02-01 ENCOUNTER — Ambulatory Visit (INDEPENDENT_AMBULATORY_CARE_PROVIDER_SITE_OTHER): Payer: BC Managed Care – PPO | Admitting: Sports Medicine

## 2015-02-01 ENCOUNTER — Encounter: Payer: Self-pay | Admitting: Sports Medicine

## 2015-02-01 VITALS — BP 119/75 | HR 61 | Ht 67.0 in | Wt 164.0 lb

## 2015-02-01 DIAGNOSIS — M2240 Chondromalacia patellae, unspecified knee: Secondary | ICD-10-CM

## 2015-02-01 DIAGNOSIS — I10 Essential (primary) hypertension: Secondary | ICD-10-CM | POA: Diagnosis not present

## 2015-02-01 MED ORDER — VALSARTAN 80 MG PO TABS: 80.0000 mg | ORAL_TABLET | Freq: Every day | ORAL | Status: DC

## 2015-02-01 NOTE — Assessment & Plan Note (Signed)
Continue with aggressive rehabilitation exercises.

## 2015-02-01 NOTE — Progress Notes (Signed)
  Subjective:    CC: Follow-up  HPI:  Cramping: Resolved  Hypertension: Mild cough with lisinopril, would like to come off of all blood pressure medications, and try to keep her blood pressure under control simply with salt restriction.  Patellofemoral chondromalacia: Has only done a single session of rehabilitation.  Past medical history, Surgical history, Family history not pertinant except as noted below, Social history, Allergies, and medications have been entered into the medical record, reviewed, and no changes needed.   Review of Systems: No fevers, chills, night sweats, weight loss, chest pain, or shortness of breath.   Objective:    General: Well Developed, well nourished, and in no acute distress.  Neuro: Alert and oriented x3, extra-ocular muscles intact, sensation grossly intact.  HEENT: Normocephalic, atraumatic, pupils equal round reactive to light, neck supple, no masses, no lymphadenopathy, thyroid nonpalpable.  Skin: Warm and dry, no rashes. Cardiac: Regular rate and rhythm, no murmurs rubs or gallops, no lower extremity edema.  Respiratory: Clear to auscultation bilaterally. Not using accessory muscles, speaking in full sentences.  Impression and Recommendations:

## 2015-02-01 NOTE — Assessment & Plan Note (Signed)
Blood pressure has been relatively well-controlled on moderate dose lisinopril. Unfortunately she has developed a bit of a cough. Discontinue lisinopril, she does want to try a low-salt diet again, she will call me back with several blood pressure readings, and if continues to do well we can keep her off of all medication. Unfortunately she does have high blood pressure again, we will start valsartan.

## 2015-02-07 ENCOUNTER — Encounter: Payer: BC Managed Care – PPO | Admitting: Physical Therapy

## 2015-02-08 ENCOUNTER — Ambulatory Visit (INDEPENDENT_AMBULATORY_CARE_PROVIDER_SITE_OTHER): Payer: BC Managed Care – PPO | Admitting: Physical Therapy

## 2015-02-08 ENCOUNTER — Encounter (INDEPENDENT_AMBULATORY_CARE_PROVIDER_SITE_OTHER): Payer: Self-pay

## 2015-02-08 VITALS — BP 136/73 | HR 72

## 2015-02-08 DIAGNOSIS — M25562 Pain in left knee: Secondary | ICD-10-CM

## 2015-02-08 DIAGNOSIS — M25561 Pain in right knee: Secondary | ICD-10-CM

## 2015-02-08 DIAGNOSIS — R531 Weakness: Secondary | ICD-10-CM

## 2015-02-08 NOTE — Therapy (Signed)
[place], [place], [address] Phone: [phone]   Fax:  [phone]  Physical Therapy Treatment  Patient Details  Name: Amy Moyer MRN: [id number] Date of Birth: Jun 17, 1961 Referring Provider:  Silverio Decamp,*  Encounter Date: 02/08/2015      PT End of Session - 02/08/15 0942    Visit Number 2   Number of Visits 6   Date for PT Re-Evaluation 03/06/15   PT Start Time 0937   PT Stop Time 1025   PT Time Calculation (min) 48 min   Activity Tolerance No increased pain;Patient tolerated treatment well      No past medical history on file.  Past Surgical History  Procedure Laterality Date  . Abdominal hysterectomy  2005    Filed Vitals:   02/08/15 0942  BP: 136/73  Pulse: 72    Visit Diagnosis:  Arthralgia of both knees  Weakness generalized      Subjective Assessment - 02/08/15 0943    Subjective Pt reports feeling sluggish; currently off BP meds due to side effects. Pt reports Lt knee pain with biking and Rt knee pain with running. Feels she may have compensated with knees due to Lt hamstring injury  3-4 months ago.    Currently in Pain? Yes   Pain Score 1    Pain Location Knee   Pain Orientation Left   Pain Descriptors / Indicators Dull   Aggravating Factors  biking on the down swing   Pain Relieving Factors medicine            [facility] PT Assessment - 02/08/15 0001    Assessment   Medical Diagnosis chondromalacia bilat knees   Onset Date/Surgical Date 11/23/14            [facility] Adult PT Treatment/Exercise - 02/08/15 0001    Self-Care   Self-Care Heat/Ice Application;Other Self-Care Comments   Heat/Ice Application instructed on ice pack to knees after workouts x 15 min as needed.    Other Self-Care Comments  Pt educated on self massage to B hamstrings and quads with ball. Demonstration given. Pt able to return demo.    Exercises   Exercises Knee/Hip   Knee/Hip Exercises: Stretches   Active Hamstring Stretch Left;Right;5 reps  leg swings   Quad Stretch Left;Right;3 reps;30 seconds  standing    Hip Flexor Stretch 5 reps  leg swings   Gastroc Stretch 2 reps;Both   Knee/Hip Exercises: Aerobic   Stationary Bike NuStep L4: 4 min    Knee/Hip Exercises: Standing   Step Down Hand Hold: 0;Step Height: 6";1 set;Left;Right   Wall Squat --  single leg - 8 reps each, 2 sets   SLS forward leans to chair x 10 reps each leg;  reaching towards floor x 3 reps each side (challenging)    Other Standing Knee Exercises single leg squats to step on mat table x 8 reps each side (trialed without step - too difficult)    Knee/Hip Exercises: Sidelying   Other Sidelying Knee/Hip Exercises sidelying plank x 20 sec each side.    Knee/Hip Exercises: Prone   Other Prone Exercises Prone - high plank x 30 sec                 PT Education - 02/08/15 1310    Education provided Yes   Education Details HEP and self care    Person(s) Educated Patient   Methods Handout;Explanation   Comprehension Verbalized understanding;Returned demonstration  PT Long Term Goals - 01/23/15 1239    PT LONG TERM GOAL #1   Title I with advanced HEP ( 03/06/15)   Time 6   Period Weeks   Status New   PT LONG TERM GOAL #2   Title increase quad flexibility to allow heel to reach buttocks prone bilat ( 03/06/15)   Time 6   Period Weeks   Status New   PT LONG TERM GOAL #3   Title run without knee pain ( 03/06/15)   Time 6   Period Weeks   Status New   PT LONG TERM GOAL #4   Title improve FOTO =/< 32% limited ( 03/06/15)   Time 6   Period Weeks   Status New               Plan - 02/08/15 1311    Clinical Impression Statement Pt tolerated all exercises with minimal to no increase in knee pain.  Notable weakness in Lt hamstring and quad with higher level exercises.  Only 2nd visit, no goals met yet.  Pt will benefit from continued PT intervention to max  function and decrease pain.    Pt will benefit from skilled therapeutic intervention in order to improve on the following deficits Decreased strength;Pain;Impaired flexibility   Rehab Potential Excellent   PT Frequency 1x / week   PT Duration 6 weeks   PT Treatment/Interventions Cryotherapy;Electrical Stimulation;Ultrasound;Moist Heat;Therapeutic exercise;Balance training;Neuromuscular re-education;Patient/family education;Vasopneumatic Device;Taping   PT Next Visit Plan Continue progressive LE /core strengthening.    Consulted and Agree with Plan of Care Patient        Problem List Patient Active Problem List   Diagnosis Date Noted  . Essential hypertension, benign 01/16/2015  . Cervical radiculopathy 01/16/2014  . Gonalgia 12/06/2013  . Acid reflux 12/06/2013  . Chondromalacia of patellofemoral joint 10/24/2013  . Medial tibial stress syndrome 10/24/2013  . Allergic rhinitis, seasonal 12/20/2012  . Carpal tunnel syndrome 07/22/2012  . Discoid lupus 07/22/2012  . Fibroid 07/22/2012  . 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

## 2015-02-08 NOTE — Patient Instructions (Addendum)
Quad Strength: Single-Leg Quarter Squat   Standing on involved leg with back against wall, slide down wall until knee is at 30-45. Return. Repeat ____ times or for ____ minutes. Do ____ sessions per day. CAUTION: You should not bend knee deep enough to cause pain. Balance / Reach   Stand on left foot, Holding __0__ pound weight in other hand. Bend knee, lowering body, and reach across. Hold __0__ seconds. Relax. Repeat __10__ times per set. Do _1-2___ sets per session. Do __1__ sessions every other day. Keep back straight.   * ice after runs/ bike rides - 10-15 min.  * small ball for self-massage. * high plank/ side plank. Each 30 sec x 2 reps, 3 x /day.    [facility] [place], [place] [address]  [phone] (office) [phone] (fax)

## 2015-02-16 ENCOUNTER — Ambulatory Visit (INDEPENDENT_AMBULATORY_CARE_PROVIDER_SITE_OTHER): Payer: BC Managed Care – PPO | Admitting: Rehabilitative and Restorative Service Providers"

## 2015-02-16 ENCOUNTER — Encounter: Payer: Self-pay | Admitting: Rehabilitative and Restorative Service Providers"

## 2015-02-16 DIAGNOSIS — M25562 Pain in left knee: Secondary | ICD-10-CM

## 2015-02-16 DIAGNOSIS — R531 Weakness: Secondary | ICD-10-CM

## 2015-02-16 DIAGNOSIS — M25561 Pain in right knee: Secondary | ICD-10-CM

## 2015-02-16 NOTE — Patient Instructions (Signed)
Quads / HF, Supine   Lie near edge of bed, bend both knees up to chest,hold right knee to chest, lower left foot toward floor off bed.  on bed. Bend hanging knee backward as you can. Hold _30-60__ seconds.  Repeat _3__ times per session. Do _2-3__ sessions per day. Stretch before and after running and a couple other times/day.   Hamstring stretch with strap - keep knee really straight HIP: Hamstrings - Supine  Opposite knee bent! Place strap around foot. Raise leg up, keep knee straight. Hold _30-60__ seconds. _3__ reps per set.  Before and after running and 2-3 other times/day.

## 2015-02-16 NOTE — Therapy (Addendum)
[place] [facility] [place], [place], [address] Phone: [phone]   Fax:  [phone]  Physical Therapy Treatment  Patient Details  Name: Coree Riester MRN: [id number] Date of Birth: [birth year] Referring Provider:  Silverio Decamp,*  Encounter Date: 02/16/2015      PT End of Session - 02/16/15 1330    Visit Number 3   Number of Visits 6   Date for PT Re-Evaluation 03/06/15   PT Start Time 0934   PT Stop Time 1028   PT Time Calculation (min) 54 min   Activity Tolerance Patient tolerated treatment well;No increased pain      History reviewed. No pertinent past medical history.  Past Surgical History  Procedure Laterality Date  . Abdominal hysterectomy  2005    There were no vitals filed for this visit.  Visit Diagnosis:  Arthralgia of both knees  Weakness generalized      Subjective Assessment - 02/16/15 0938    Subjective Notes that when she warms up properly and stretches she has less pain. Less pain initially but still has some pain in billat knees with running   Currently in Pain? No/denies            [facility] PT Assessment - 02/16/15 0001    Flexibility   Soft Tissue Assessment /Muscle Length --  prone quad stretch heel touching buttocks bilat          [facility] Adult PT Treatment/Exercise - 02/16/15 0001    Exercises   Exercises --  HS and quad stretch before and after exercises   Knee/Hip Exercises: Stretches   Passive Hamstring Stretch Right;Left;3 reps;60 seconds   Passive Hamstring Stretch Limitations with strap - added abd/add stretch with HS stretch   Quad Stretch 60 seconds;3 reps;Right;Left   Quad Stretch Limitations added swim noodle   Hip Flexor Stretch 3 reps;30 seconds   Hip Flexor Stretch Limitations one leg off edge of table   Knee/Hip Exercises: Aerobic   Stationary Bike NuStep L4 8 min   Knee/Hip Exercises: Standing   Lateral Step Up Right;Left;2 sets;15 reps;Hand  Hold: 2;Step Height: 6"  6 in step   Step Down Right;Left;2 sets;15 reps;Step Height: 6"   Wall Squat --  single leg - 8 reps each, 2 sets   Cryotherapy   Number Minutes Cryotherapy 15 Minutes   Cryotherapy Location Knee   Type of Cryotherapy Ice pack           PT Education - 02/16/15 1328    Education provided Yes   Education Details Patient will increase end range stretches; continue with stretching - attempting to stretch before and after running/additional times; continue with strengthening   Person(s) Educated Patient   Methods Explanation;Demonstration;Tactile cues;Verbal cues;Handout   Comprehension Verbalized understanding;Returned demonstration;Verbal cues required;Tactile cues required           PT Long Term Goals - 02/16/15 1333    PT LONG TERM GOAL #1   Title I with advanced HEP ( 03/06/15)   Time 6   Period Weeks   Status On-going   PT LONG TERM GOAL #2   Title increase quad flexibility to allow heel to reach buttocks prone bilat ( 03/06/15)   Time 6   Period Weeks   Status Achieved   PT LONG TERM GOAL #3   Title run without knee pain ( 03/06/15)   Period Weeks   Status On-going   PT LONG TERM GOAL #4   Title improve FOTO =/<  32% limited ( 03/06/15)   Time 6   Period Weeks   Status On-going           Plan - 02/16/15 1331    Clinical Impression Statement Worked on end range stretching increasing quad stretch by extending hip on 1/2 foam roll for quad stretch and keeping knee in full extension for hamstring stretch. Continued with strengthening.    Pt will benefit from skilled therapeutic intervention in order to improve on the following deficits Decreased strength;Pain;Impaired flexibility   Rehab Potential Excellent   PT Frequency 1x / week   PT Duration 6 weeks   PT Treatment/Interventions Cryotherapy;Electrical Stimulation;Ultrasound;Moist Heat;Therapeutic exercise;Balance training;Neuromuscular re-education;Patient/family education;Vasopneumatic  Device;Taping   PT Next Visit Plan Continue progressive LE /core strengthening.    PT Home Exercise Plan stretching; strengthening   Consulted and Agree with Plan of Care Patient        Problem List Patient Active Problem List   Diagnosis Date Noted  . Essential hypertension, benign 01/16/2015  . Cervical radiculopathy 01/16/2014  . Gonalgia 12/06/2013  . Acid reflux 12/06/2013  . Chondromalacia of patellofemoral joint 10/24/2013  . Medial tibial stress syndrome 10/24/2013  . Allergic rhinitis, seasonal 12/20/2012  . Carpal tunnel syndrome 07/22/2012  . Discoid lupus 07/22/2012  . Fibroid 07/22/2012  . 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 Patient was I in appropriate stretching and strengthening program and elected to d/c PT   Remaining deficits: Chronic tightness - good improvement with treatment and HEP   Education / Equipment: HEP/theraband  Plan: Patient agrees to discharge.  Patient goals were partially met. Patient is being discharged due to the patient's request.  ?????    Demia Viera P. Helene Kelp, PT, MPH 03/15/15 8:25am

## 2015-02-23 ENCOUNTER — Encounter: Payer: BC Managed Care – PPO | Admitting: Physical Therapy

## 2015-03-02 ENCOUNTER — Encounter: Payer: Self-pay | Admitting: Sports Medicine

## 2015-03-02 ENCOUNTER — Ambulatory Visit (INDEPENDENT_AMBULATORY_CARE_PROVIDER_SITE_OTHER): Payer: BC Managed Care – PPO | Admitting: Sports Medicine

## 2015-03-02 VITALS — BP 136/80 | HR 61 | Ht 67.0 in | Wt 161.0 lb

## 2015-03-02 DIAGNOSIS — R011 Cardiac murmur, unspecified: Secondary | ICD-10-CM

## 2015-03-02 DIAGNOSIS — I071 Rheumatic tricuspid insufficiency: Secondary | ICD-10-CM | POA: Insufficient documentation

## 2015-03-02 LAB — CBC WITH DIFFERENTIAL/PLATELET
Basophils Absolute: 0 K/uL (ref 0.0–0.1)
Basophils Relative: 0 % (ref 0–1)
Eosinophils Absolute: 0 K/uL (ref 0.0–0.7)
Eosinophils Relative: 1 % (ref 0–5)
HCT: 36.3 % (ref 36.0–46.0)
Hemoglobin: 12.6 g/dL (ref 12.0–15.0)
Lymphocytes Relative: 35 % (ref 12–46)
Lymphs Abs: 1.3 10*3/uL (ref 0.7–4.0)
MCH: 30.7 pg (ref 26.0–34.0)
MCHC: 34.7 g/dL (ref 30.0–36.0)
MCV: 88.5 fL (ref 78.0–100.0)
MPV: 9.5 fL (ref 8.6–12.4)
Monocytes Absolute: 0.3 K/uL (ref 0.1–1.0)
Monocytes Relative: 8 % (ref 3–12)
Neutro Abs: 2 K/uL (ref 1.7–7.7)
Neutrophils Relative %: 56 % (ref 43–77)
Platelets: 294 10*3/uL (ref 150–400)
RBC: 4.1 MIL/uL (ref 3.87–5.11)
RDW: 14 % (ref 11.5–15.5)
WBC: 3.6 K/uL — ABNORMAL LOW (ref 4.0–10.5)

## 2015-03-02 LAB — TSH: TSH: 1.126 u[IU]/mL (ref 0.350–4.500)

## 2015-03-02 NOTE — Progress Notes (Signed)
  Subjective:    CC: follow-up  HPI: Muscle cramps: We have had a great deal of difficulty determining the cause of the muscle cramps, we have tried magnesium, check electrolytes, CK levels, everything has been normal, she improved slightly with some magnesium but we have a bit of a clue this time, she tells me that every time the cramps have occurred, she has either had a Bartholin's cyst that she has ruptured herself, the most recent time she did have a urinary tract infection. She is further describing cramps as shaking sensations accompanied by chills, and a fever above 100. She was seen in the emergency department and had a urinalysis that was positive, and a negative CT scan, no blood cultures were done, she was treated with anti-biotics and is feeling better.  Past medical history, Surgical history, Family history not pertinant except as noted below, Social history, Allergies, and medications have been entered into the medical record, reviewed, and no changes needed.   Review of Systems: No fevers, chills, night sweats, weight loss, chest pain, or shortness of breath.   Objective:    General: Well Developed, well nourished, and in no acute distress.  Neuro: Alert and oriented x3, extra-ocular muscles intact, sensation grossly intact.  HEENT: Normocephalic, atraumatic, pupils equal round reactive to light, neck supple, no masses, no lymphadenopathy, thyroid nonpalpable.  Skin: Warm and dry, no rashes. Cardiac: Regular rate and rhythm, no lower extremity edema.no rubs or gallops, there is a 1/6 systolic ejection murmur heard best at the left third parasternal intercostal space, no visible splinter hemorrhages.  Respiratory: Clear to auscultation bilaterally. Not using accessory muscles, speaking in full sentences.  Impression and Recommendations:    I spent 25 minutes with this patient, greater than 50% was face-to-face time counseling regarding the above diagnoses

## 2015-03-02 NOTE — Assessment & Plan Note (Signed)
We have had great difficulty finding the cause for Amy Moyer's muscle cramps, on further questioning she describes it as rigors, chills, during these episodes she does have a temperature above 100F, and she will either have urinary symptoms or a Bartholin's cyst, a previous time this happened she ruptured her Bartholin's cyst herself, and then developed chills and rigors.  The most recent time she went to the emergency department and had a urinary tract infection. She has grown out pansensitive Escherichia coli, currently in the setting of a systolic murmur, repeated rigors, these probably represent episodes of bacteremia. We are going to get a blood culture, urinalysis, urine culture, as well as an echocardiogram.

## 2015-03-03 LAB — URINALYSIS
Bilirubin Urine: NEGATIVE
Glucose, UA: NEGATIVE
Hgb urine dipstick: NEGATIVE
Ketones, ur: NEGATIVE
Leukocytes, UA: NEGATIVE
Nitrite: NEGATIVE
Protein, ur: NEGATIVE
Specific Gravity, Urine: 1.012 (ref 1.001–1.035)
pH: 7.5 (ref 5.0–8.0)

## 2015-03-03 LAB — COMPREHENSIVE METABOLIC PANEL WITH GFR
ALT: 13 U/L (ref 6–29)
BUN: 11 mg/dL (ref 7–25)
CO2: 26 mmol/L (ref 20–31)
Sodium: 139 mmol/L (ref 135–146)
Total Protein: 7.9 g/dL (ref 6.1–8.1)

## 2015-03-03 LAB — SEDIMENTATION RATE: Sed Rate: 14 mm/hr (ref 0–30)

## 2015-03-03 LAB — COMPREHENSIVE METABOLIC PANEL
AST: 18 U/L (ref 10–35)
Albumin: 4.2 g/dL (ref 3.6–5.1)
Alkaline Phosphatase: 52 U/L (ref 33–130)
Calcium: 9.3 mg/dL (ref 8.6–10.4)
Chloride: 102 mmol/L (ref 98–110)
Creat: 0.68 mg/dL (ref 0.50–1.05)
Glucose, Bld: 84 mg/dL (ref 65–99)
Potassium: 4.4 mmol/L (ref 3.5–5.3)
Total Bilirubin: 0.5 mg/dL (ref 0.2–1.2)

## 2015-03-03 LAB — CK: Total CK: 60 U/L (ref 7–177)

## 2015-03-04 LAB — URINE CULTURE
Colony Count: NO GROWTH
Organism ID, Bacteria: NO GROWTH

## 2015-03-12 ENCOUNTER — Other Ambulatory Visit (HOSPITAL_COMMUNITY): Payer: BC Managed Care – PPO

## 2015-03-13 ENCOUNTER — Ambulatory Visit (HOSPITAL_COMMUNITY): Payer: BC Managed Care – PPO | Attending: Cardiovascular Disease

## 2015-03-13 ENCOUNTER — Other Ambulatory Visit: Payer: Self-pay

## 2015-03-13 DIAGNOSIS — R011 Cardiac murmur, unspecified: Secondary | ICD-10-CM

## 2015-03-13 DIAGNOSIS — I517 Cardiomegaly: Secondary | ICD-10-CM | POA: Diagnosis not present

## 2015-03-16 ENCOUNTER — Ambulatory Visit (INDEPENDENT_AMBULATORY_CARE_PROVIDER_SITE_OTHER): Payer: BC Managed Care – PPO | Admitting: Sports Medicine

## 2015-03-16 ENCOUNTER — Encounter: Payer: Self-pay | Admitting: Sports Medicine

## 2015-03-16 VITALS — BP 150/80 | HR 66 | Ht 67.0 in | Wt 163.0 lb

## 2015-03-16 DIAGNOSIS — I10 Essential (primary) hypertension: Secondary | ICD-10-CM

## 2015-03-16 DIAGNOSIS — I071 Rheumatic tricuspid insufficiency: Secondary | ICD-10-CM | POA: Diagnosis not present

## 2015-03-16 DIAGNOSIS — D72819 Decreased white blood cell count, unspecified: Secondary | ICD-10-CM

## 2015-03-16 LAB — CBC
HCT: 38 % (ref 36.0–46.0)
Hemoglobin: 12.7 g/dL (ref 12.0–15.0)
MCH: 30.4 pg (ref 26.0–34.0)
MCHC: 33.4 g/dL (ref 30.0–36.0)
MCV: 90.9 fL (ref 78.0–100.0)
MPV: 10.2 fL (ref 8.6–12.4)
Platelets: 246 K/uL (ref 150–400)
RBC: 4.18 MIL/uL (ref 3.87–5.11)
RDW: 13.6 % (ref 11.5–15.5)
WBC: 3.6 K/uL — ABNORMAL LOW (ref 4.0–10.5)

## 2015-03-16 MED ORDER — VALSARTAN 160 MG PO TABS: 160.0000 mg | ORAL_TABLET | Freq: Every day | ORAL | Status: DC

## 2015-03-16 NOTE — Assessment & Plan Note (Signed)
No vegetations, no further intervention needed, if she gets another episode chills, we will get cultures.

## 2015-03-16 NOTE — Progress Notes (Signed)
  Subjective:    CC: Follow-up  HPI: This pleasant 54 year old female returns, she had been having occasional episodes of rigors and chills, ultimately a urine culture was positive, and everything resolved with antibiotics, it was a speculum that her symptoms likely represented episodes of bacteremia, at the last visit I did hear a systolic murmur so we obtained an echocardiogram, which showed trivial tricuspid regurgitation but no evidence of vegetation. Since then symptoms have been resolved. The only abnormality was minimal leukopenia and she is amenable to recheck this.  Hypertension: Continues to be elevated on 80 mg of valsartan.  Past medical history, Surgical history, Family history not pertinant except as noted below, Social history, Allergies, and medications have been entered into the medical record, reviewed, and no changes needed.   Review of Systems: No fevers, chills, night sweats, weight loss, chest pain, or shortness of breath.   Objective:    General: Well Developed, well nourished, and in no acute distress.  Neuro: Alert and oriented x3, extra-ocular muscles intact, sensation grossly intact.  HEENT: Normocephalic, atraumatic, pupils equal round reactive to light, neck supple, no masses, no lymphadenopathy, thyroid nonpalpable.  Skin: Warm and dry, no rashes. Cardiac: Regular rate and rhythm, no rubs or gallops, mild systolic murmur, no lower extremity edema.  Respiratory: Clear to auscultation bilaterally. Not using accessory muscles, speaking in full sentences.  Impression and Recommendations:    I spent 25 minutes with this patient, greater than 50% was face-to-face time counseling regarding the above diagnoses

## 2015-03-16 NOTE — Assessment & Plan Note (Addendum)
Rechecking CBC, and globulins and HIV as well as CMV, EBV titers.  Persistent leukopenia, mild with  IgG hypogammaglobulinemia,  Referral to Hematology at [facility]

## 2015-03-16 NOTE — Assessment & Plan Note (Signed)
Increasing to 160 mg of valsartan. Patient will call back with home blood pressure readings.

## 2015-03-17 LAB — HIV ANTIBODY (ROUTINE TESTING W REFLEX): HIV 1&2 Ab, 4th Generation: NONREACTIVE

## 2015-03-18 LAB — IGG, IGA, IGM
IgA: 286 mg/dL (ref 69–380)
IgG (Immunoglobin G), Serum: 1620 mg/dL (ref 690–1700)
IgM, Serum: 30 mg/dL — ABNORMAL LOW (ref 52–322)

## 2015-03-19 LAB — EPSTEIN-BARR VIRUS VCA, IGM: EBV VCA IgM: <10 U/mL (ref <36.0)

## 2015-03-19 LAB — EPSTEIN-BARR VIRUS VCA, IGG: EBV VCA IgG: >750 U/mL — ABNORMAL HIGH (ref <18.0)

## 2015-03-21 LAB — CMV IGM: CMV IgM: <8 [AU]/ml (ref <30.00)

## 2015-03-21 NOTE — Addendum Note (Signed)
Addended by: Silverio Decamp on: 03/21/2015 11:40 AM   Modules accepted: Orders

## 2015-04-30 ENCOUNTER — Other Ambulatory Visit: Payer: BC Managed Care – PPO

## 2015-04-30 ENCOUNTER — Ambulatory Visit: Payer: BC Managed Care – PPO | Admitting: Hematology & Oncology

## 2015-04-30 ENCOUNTER — Ambulatory Visit: Payer: BC Managed Care – PPO

## 2015-05-03 ENCOUNTER — Ambulatory Visit (HOSPITAL_BASED_OUTPATIENT_CLINIC_OR_DEPARTMENT_OTHER): Payer: BC Managed Care – PPO | Admitting: Hematology & Oncology

## 2015-05-03 ENCOUNTER — Other Ambulatory Visit (HOSPITAL_BASED_OUTPATIENT_CLINIC_OR_DEPARTMENT_OTHER): Payer: BC Managed Care – PPO

## 2015-05-03 ENCOUNTER — Ambulatory Visit: Payer: BC Managed Care – PPO

## 2015-05-03 ENCOUNTER — Encounter: Payer: Self-pay | Admitting: Hematology & Oncology

## 2015-05-03 VITALS — BP 145/76 | HR 61 | Temp 98.2°F | Resp 18 | Ht 67.0 in | Wt 165.0 lb

## 2015-05-03 DIAGNOSIS — Z1231 Encounter for screening mammogram for malignant neoplasm of breast: Secondary | ICD-10-CM

## 2015-05-03 DIAGNOSIS — D72819 Decreased white blood cell count, unspecified: Secondary | ICD-10-CM

## 2015-05-03 DIAGNOSIS — Z1239 Encounter for other screening for malignant neoplasm of breast: Secondary | ICD-10-CM

## 2015-05-03 LAB — CBC WITH DIFFERENTIAL (CANCER CENTER ONLY)
BASO#: 0 10*3/uL (ref 0.0–0.2)
BASO%: 0.2 % (ref 0.0–2.0)
EOS ABS: 0 10*3/uL (ref 0.0–0.5)
EOS%: 0.5 % (ref 0.0–7.0)
HEMATOCRIT: 36 % (ref 34.8–46.6)
HGB: 12.4 g/dL (ref 11.6–15.9)
LYMPH#: 1.5 10*3/uL (ref 0.9–3.3)
LYMPH%: 35.5 % (ref 14.0–48.0)
MCH: 30.7 pg (ref 26.0–34.0)
MCHC: 34.4 g/dL (ref 32.0–36.0)
MCV: 89 fL (ref 81–101)
MONO#: 0.4 10*3/uL (ref 0.1–0.9)
MONO%: 9.5 % (ref 0.0–13.0)
NEUT#: 2.2 10*3/uL (ref 1.5–6.5)
NEUT%: 54.3 % (ref 39.6–80.0)
PLATELETS: 214 10*3/uL (ref 145–400)
RBC: 4.04 10*6/uL (ref 3.70–5.32)
RDW: 12.1 % (ref 11.1–15.7)
WBC: 4.1 10*3/uL (ref 3.9–10.0)

## 2015-05-03 LAB — CHCC SATELLITE - SMEAR

## 2015-05-03 NOTE — Progress Notes (Signed)
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 OTHER    New Patient  : I'm not sure as to why I'm here.  HPI: Amy Moyer is a very charming 54 year old Afro-American female. 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  She apparently has been having some issues with "sepsis". She's never been admitted. This all seemed to start with infection of a Bartholin's gland cyst.  She was seen by her family doctor. She was found to have minimal leukopenia. Her white cell count was 3.6. She was not anemic or thrombocytopenic.  She also was felt to have low immunoglobulin level. She had a normal IgG from the records that were sent. Her IgG level was 1620.  She has ever had any obvious issue with infections. She's had no pneumonia. She's had no urinary tract infections.  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 She has not required any therapy for this.  She is active. She runs.  She has had surgery in the past. She has had a partial hysterectomy. She has had a C-section. She had no problems with infections.  She has no history of sickle cell and the family. There may be a sister with sickle trait.  There is no history of cancer in the family.  She has never had a mammogram. She has had her colonoscopies. I talked to her at length about having a mammogram done. I did this is incredibly important. If necessary, I will order this for her.  She was kindly referred to the [facility] for evaluation of the leukopenia.     No past medical history on file.:  Past Surgical History  Procedure Laterality Date  . Abdominal hysterectomy  2005  :   Current outpatient prescriptions:  .  magnesium oxide (MAG-OX) 400 MG tablet, Take 2 tablets (800 mg total) by mouth at bedtime., Disp: 90 tablet, Rfl: 3 .  meloxicam (MOBIC) 15 MG tablet, One tab PO qAM with breakfast for 2 weeks, then daily  prn pain., Disp: 30 tablet, Rfl: 3 .  sertraline (ZOLOFT) 50 MG tablet, Take 50 mg by mouth., Disp: , Rfl:  .  valsartan (DIOVAN) 160 MG tablet, Take 1 tablet (160 mg total) by mouth daily., Disp: 30 tablet, Rfl: 1 .  Vitamin D, Ergocalciferol, (DRISDOL) 50000 UNITS CAPS capsule, , Disp: , Rfl: :  :  Allergies  Allergen Reactions  . Ace Inhibitors Cough  :  Family History  Problem Relation Age of Onset  . Hypertension Mother   . Diabetes Mother   :  Social History   Social History  . Marital Status: Married    Spouse Name: N/A  . Number of Children: N/A  . Years of Education: N/A   Occupational History  . Not on file.   Social History Main Topics  . Smoking status: Never Smoker   . Smokeless tobacco: Never Used  . Alcohol Use: 0.0 oz/week    0 Standard drinks or equivalent per week  . Drug Use: Not on file  . Sexual Activity: Not Currently    Birth Control/ Protection: None   Other Topics Concern  . Not on file   Social History Narrative  :  Pertinent items are noted in HPI.  Exam: @IPVITALS @  well-developed and well-nourished African-American female. Her vital signs show a temperature of 98.2. Pulse 61. Blood pressure 145/76. Weight is 165 pounds. Head  and neck exam shows no ocular or oral lesions. There are no palpable cervical or supraclavicular lymph nodes. Lungs are clear. Cardiac exam regular rate and rhythm with no murmurs, rubs or bruits. Abdomen is soft. She has good bowel sounds. There is no fluid wave. There is no palpable liver or spleen tip. Back exam shows no tenderness over the spine, ribs or hips. Extremities shows no clubbing, cyanosis or edema. She has good range of motion of her joints. There is no joint swelling erythema or redness. Skin exam shows no rashes, ecchymoses or petechia. Neurological exam shows no focal neurological deficits.    Recent Labs  05/03/15 1431  WBC 4.1  HGB 12.4  HCT 36.0  PLT 214   No results for input(s): NA, K,  CL, CO2, GLUCOSE, BUN, CREATININE, CALCIUM in the last 72 hours.  Blood smear review: Normochromic and normocytic population of red blood cells. She has a couple target cells. She has no nucleated red cells. I see no teardrop cells. She has no schistocytes or spherocytes. I see no rouleau formation. White cells are minimally decreased in number. She has good maturation of her myeloid cells. There is no hypersegmented polys. She has no immature myeloid or lymphoid forms. There are no atypical lymphocytes. Platelets are adequate in number and size. Platelets are well granulated.  Pathology: None     Assessment and Plan:  Amy Moyer is a 54 year old African-American female. She has transient leukopenia. 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 This is seen about 25% of African-American patients. It does not imply any problems in the future. He does not meet any increased risk of infection. There is no increased risk of a bone marrow disorder in the future.  Her white blood cell count is good today.  She has discoid lupus. She does not have systemic lupus. As such, I don't think that a collagen vascular problem is an issue.  From my point of view, the biggest problem she has is not having a mammogram. I will order this myself for her. Hopefully, she will get this done. I impressed upon her the importance of getting a mammogram and the fact that African-American females are at a higher risk of breast cancer. She understands this.  For now, I don't see any indication that we have to get her back to the office. I really don't think that we are adding to her medical care.  I think that the fact that she was never hospitalized for these "septic" episodes, indicates that her immune system is working pretty well.  I spent about 45 minutes with her. I answered all of her questions. I reviewed her lab work with her.

## 2015-05-07 LAB — ANA: ANA: NEGATIVE

## 2015-05-07 LAB — PROTEIN ELECTROPHORESIS, SERUM, WITH REFLEX
Albumin ELP: 4.2 g/dL (ref 3.8–4.8)
Alpha-1-Globulin: 0.3 g/dL (ref 0.2–0.3)
Alpha-2-Globulin: 0.7 g/dL (ref 0.5–0.9)
Beta 2: 0.4 g/dL (ref 0.2–0.5)
Beta Globulin: 0.4 g/dL (ref 0.4–0.6)
GAMMA GLOBULIN: 1.7 g/dL (ref 0.8–1.7)
TOTAL PROTEIN, SERUM ELECTROPHOR: 7.7 g/dL (ref 6.1–8.1)

## 2015-05-07 LAB — IGG, IGA, IGM
IGG (IMMUNOGLOBIN G), SERUM: 1800 mg/dL — AB (ref 690–1700)
IgA: 266 mg/dL (ref 69–380)
IgM, Serum: 34 mg/dL — ABNORMAL LOW (ref 52–322)

## 2015-05-11 ENCOUNTER — Other Ambulatory Visit: Payer: Self-pay | Admitting: Sports Medicine

## 2015-05-11 DIAGNOSIS — I10 Essential (primary) hypertension: Secondary | ICD-10-CM

## 2015-05-11 MED ORDER — VALSARTAN 160 MG PO TABS: 160.0000 mg | ORAL_TABLET | Freq: Every day | ORAL | Status: DC

## 2015-05-15 ENCOUNTER — Ambulatory Visit (HOSPITAL_BASED_OUTPATIENT_CLINIC_OR_DEPARTMENT_OTHER): Payer: BC Managed Care – PPO

## 2015-05-15 ENCOUNTER — Ambulatory Visit (HOSPITAL_BASED_OUTPATIENT_CLINIC_OR_DEPARTMENT_OTHER)
Admission: RE | Admit: 2015-05-15 | Discharge: 2015-05-15 | Disposition: A | Discharge status: Home / Self Care | Payer: BC Managed Care – PPO | Source: Ambulatory Visit | Attending: Hematology & Oncology | Admitting: Hematology & Oncology

## 2015-05-15 DIAGNOSIS — R928 Other abnormal and inconclusive findings on diagnostic imaging of breast: Secondary | ICD-10-CM | POA: Diagnosis not present

## 2015-05-15 DIAGNOSIS — Z1239 Encounter for other screening for malignant neoplasm of breast: Secondary | ICD-10-CM

## 2015-05-15 DIAGNOSIS — Z1231 Encounter for screening mammogram for malignant neoplasm of breast: Secondary | ICD-10-CM | POA: Insufficient documentation

## 2015-05-17 ENCOUNTER — Other Ambulatory Visit: Payer: Self-pay | Admitting: Hematology & Oncology

## 2015-05-17 DIAGNOSIS — R928 Other abnormal and inconclusive findings on diagnostic imaging of breast: Secondary | ICD-10-CM

## 2015-05-24 ENCOUNTER — Other Ambulatory Visit: Payer: Self-pay | Admitting: Hematology & Oncology

## 2015-05-24 ENCOUNTER — Ambulatory Visit
Admission: RE | Admit: 2015-05-24 | Discharge: 2015-05-24 | Disposition: A | Discharge status: Home / Self Care | Payer: BC Managed Care – PPO | Source: Ambulatory Visit | Attending: Hematology & Oncology | Admitting: Hematology & Oncology

## 2015-05-24 DIAGNOSIS — R928 Other abnormal and inconclusive findings on diagnostic imaging of breast: Secondary | ICD-10-CM

## 2015-06-18 ENCOUNTER — Ambulatory Visit (INDEPENDENT_AMBULATORY_CARE_PROVIDER_SITE_OTHER): Payer: BC Managed Care – PPO | Admitting: Sports Medicine

## 2015-06-18 ENCOUNTER — Encounter: Payer: Self-pay | Admitting: Sports Medicine

## 2015-06-18 VITALS — BP 146/79 | HR 68 | Wt 174.0 lb

## 2015-06-18 DIAGNOSIS — M2240 Chondromalacia patellae, unspecified knee: Secondary | ICD-10-CM

## 2015-06-18 DIAGNOSIS — S86892A Other injury of other muscle(s) and tendon(s) at lower leg level, left leg, initial encounter: Secondary | ICD-10-CM | POA: Diagnosis not present

## 2015-06-18 NOTE — Assessment & Plan Note (Signed)
Patient can return for injections when needed.

## 2015-06-18 NOTE — Progress Notes (Signed)
  Subjective:    CC: Follow-up  HPI: Leukopenia: Resolved on a recent recheck with hematology/oncology. Hypogammaglobulinemia has also resolved.  Bilateral knee osteoarthritis: Starting to have a recurrence of pain but declines any oral medications, she does want another injection but would like to wait.  Ankle pain: Localized at the navicular on the left side, needs a pair of orthotics.  Past medical history, Surgical history, Family history not pertinant except as noted below, Social history, Allergies, and medications have been entered into the medical record, reviewed, and no changes needed.   Review of Systems: No fevers, chills, night sweats, weight loss, chest pain, or shortness of breath.   Objective:    General: Well Developed, well nourished, and in no acute distress.  Neuro: Alert and oriented x3, extra-ocular muscles intact, sensation grossly intact.  HEENT: Normocephalic, atraumatic, pupils equal round reactive to light, neck supple, no masses, no lymphadenopathy, thyroid nonpalpable.  Skin: Warm and dry, no rashes. Cardiac: Regular rate and rhythm, no murmurs rubs or gallops, no lower extremity edema.  Respiratory: Clear to auscultation bilaterally. Not using accessory muscles, speaking in full sentences.  Impression and Recommendations:

## 2015-06-18 NOTE — Assessment & Plan Note (Signed)
Return for a new set of custom orthotics. 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

## 2015-06-25 ENCOUNTER — Ambulatory Visit (INDEPENDENT_AMBULATORY_CARE_PROVIDER_SITE_OTHER): Payer: BC Managed Care – PPO | Admitting: Sports Medicine

## 2015-06-25 ENCOUNTER — Encounter: Payer: Self-pay | Admitting: Sports Medicine

## 2015-06-25 ENCOUNTER — Other Ambulatory Visit: Payer: Self-pay | Admitting: Hematology & Oncology

## 2015-06-25 VITALS — BP 163/90 | HR 69

## 2015-06-25 DIAGNOSIS — M2240 Chondromalacia patellae, unspecified knee: Secondary | ICD-10-CM | POA: Diagnosis not present

## 2015-06-25 DIAGNOSIS — R928 Other abnormal and inconclusive findings on diagnostic imaging of breast: Secondary | ICD-10-CM

## 2015-06-25 NOTE — Progress Notes (Signed)
    Patient was fitted for a : standard, cushioned, semi-rigid orthotic. The orthotic was heated and afterward the patient stood on the orthotic blank positioned on the orthotic stand. The patient was positioned in subtalar neutral position and 10 degrees of ankle dorsiflexion in a weight bearing stance. After completion of molding, a stable base was applied to the orthotic blank. The blank was ground to a stable position for weight bearing. Size: 8 Base: White Health and safety inspector and Padding: None The patient ambulated these, and they were very comfortable.  Procedure: Real-time Ultrasound Guided Injection of Left knee Device: GE Logiq E  Verbal informed consent obtained.  Time-out conducted.  Noted no overlying erythema, induration, or other signs of local infection.  Skin prepped in a sterile fashion.  Local anesthesia: Topical Ethyl chloride.  With sterile technique and under real time ultrasound guidance:  1 mL kenalog 40, 2 mL lidocaine, 2 mL Marcaine injected easily. Completed without difficulty  Pain immediately resolved suggesting accurate placement of the medication.  Advised to call if fevers/chills, erythema, induration, drainage, or persistent bleeding.  Images permanently stored and available for review in the ultrasound unit.  Impression: Technically successful ultrasound guided injection.  Procedure: Real-time Ultrasound Guided Injection of right knee Device: GE Logiq E  Verbal informed consent obtained.  Time-out conducted.  Noted no overlying erythema, induration, or other signs of local infection.  Skin prepped in a sterile fashion.  Local anesthesia: Topical Ethyl chloride.  With sterile technique and under real time ultrasound guidance:  1 mL kenalog 40, 2 mL lidocaine, 2 mL Marcaine injected easily. Completed without difficulty  Pain immediately resolved suggesting accurate placement of the medication.  Advised to call if fevers/chills, erythema,  induration, drainage, or persistent bleeding.  Images permanently stored and available for review in the ultrasound unit.  Impression: Technically successful ultrasound guided injection.  I spent 40 minutes with this patient, greater than 50% was face-to-face time counseling regarding the below diagnosis and separate from time spent during the above procedures.

## 2015-06-25 NOTE — Assessment & Plan Note (Addendum)
Bilateral injection as well as custom orthotics, return in one month. She is having a bit of tibialis posterior dysfunction as well as navicular pain. If no better in one month with orthotics we will cast or boot her.

## 2015-06-26 ENCOUNTER — Ambulatory Visit
Admission: RE | Admit: 2015-06-26 | Discharge: 2015-06-26 | Disposition: A | Discharge status: Home / Self Care | Payer: BC Managed Care – PPO | Source: Ambulatory Visit | Attending: Hematology & Oncology | Admitting: Hematology & Oncology

## 2015-06-26 DIAGNOSIS — R928 Other abnormal and inconclusive findings on diagnostic imaging of breast: Secondary | ICD-10-CM

## 2015-07-16 ENCOUNTER — Other Ambulatory Visit: Payer: Self-pay

## 2015-07-16 DIAGNOSIS — I10 Essential (primary) hypertension: Secondary | ICD-10-CM

## 2015-07-16 MED ORDER — VALSARTAN 160 MG PO TABS: 160.0000 mg | ORAL_TABLET | Freq: Every day | ORAL | Status: DC

## 2015-08-09 ENCOUNTER — Encounter: Payer: Self-pay | Admitting: Obstetrics & Gynecology

## 2015-08-09 ENCOUNTER — Encounter (HOSPITAL_COMMUNITY): Payer: Self-pay | Admitting: *Deleted

## 2015-08-09 ENCOUNTER — Ambulatory Visit (INDEPENDENT_AMBULATORY_CARE_PROVIDER_SITE_OTHER): Payer: BC Managed Care – PPO | Admitting: Obstetrics & Gynecology

## 2015-08-09 VITALS — BP 143/75 | HR 72 | Resp 16 | Ht 67.0 in | Wt 179.0 lb

## 2015-08-09 DIAGNOSIS — N751 Abscess of Bartholin's gland: Secondary | ICD-10-CM | POA: Diagnosis not present

## 2015-08-09 NOTE — Progress Notes (Signed)
   Subjective:    Patient ID: Amy Moyer, female    DOB: 03/05/1961, 55 y.o.   MRN: [id number]  HPI  Amy Moyer is a 54 yo M AAP1 (62 son) here today with a recurrent left Bartholins cyst since before Christmas.   Review of Systems She reports a normal UTD pap and mammogram    Objective:   Physical Exam WNWHBFNAD Breathing, conversing, and ambulating normally Left vulva with 6 cm Bartholin's abscess I prepped it with betadine after obtaining consent. I used 1% lidocaine. I then opened the cyst and lots of pus came out. I made sure there were no loculations by using a hemostat inside. I placed a Word catheter. She tolerated the procedure well.       Assessment & Plan:  Recurrent Bartholin's abscess RTC 2 weeks for catheter removal I have sent Amy Moyer a note to schedule a marsupialization procedure per Amy Moyer's request

## 2015-08-14 ENCOUNTER — Inpatient Hospital Stay (HOSPITAL_COMMUNITY)
Admission: RE | Admit: 2015-08-14 | Discharge: 2015-08-14 | Disposition: A | Discharge status: Home / Self Care | Payer: BC Managed Care – PPO | Source: Ambulatory Visit

## 2015-08-14 ENCOUNTER — Inpatient Hospital Stay (HOSPITAL_COMMUNITY): Admission: RE | Admit: 2015-08-14 | Payer: BC Managed Care – PPO | Source: Ambulatory Visit

## 2015-08-27 ENCOUNTER — Ambulatory Visit (INDEPENDENT_AMBULATORY_CARE_PROVIDER_SITE_OTHER): Payer: BC Managed Care – PPO | Admitting: Obstetrics & Gynecology

## 2015-08-27 ENCOUNTER — Encounter (HOSPITAL_COMMUNITY)
Admission: RE | Admit: 2015-08-27 | Discharge: 2015-08-27 | Disposition: A | Discharge status: Home / Self Care | Payer: BC Managed Care – PPO | Source: Ambulatory Visit | Attending: Obstetrics & Gynecology | Admitting: Obstetrics & Gynecology

## 2015-08-27 ENCOUNTER — Encounter (HOSPITAL_COMMUNITY): Payer: Self-pay

## 2015-08-27 ENCOUNTER — Encounter: Payer: Self-pay | Admitting: Obstetrics & Gynecology

## 2015-08-27 VITALS — BP 140/84 | HR 84 | Resp 16 | Ht 67.0 in | Wt 179.0 lb

## 2015-08-27 DIAGNOSIS — Z01812 Encounter for preprocedural laboratory examination: Secondary | ICD-10-CM | POA: Insufficient documentation

## 2015-08-27 DIAGNOSIS — N751 Abscess of Bartholin's gland: Secondary | ICD-10-CM

## 2015-08-27 HISTORY — DX: Personal history of other diseases of the digestive system: Z87.19

## 2015-08-27 LAB — CBC
HCT: 35.9 % — ABNORMAL LOW (ref 36.0–46.0)
HEMOGLOBIN: 12.5 g/dL (ref 12.0–15.0)
MCH: 30.6 pg (ref 26.0–34.0)
MCHC: 34.8 g/dL (ref 30.0–36.0)
MCV: 87.8 fL (ref 78.0–100.0)
Platelets: 244 10*3/uL (ref 150–400)
RBC: 4.09 MIL/uL (ref 3.87–5.11)
RDW: 12.9 % (ref 11.5–15.5)
WBC: 5.5 10*3/uL (ref 4.0–10.5)

## 2015-08-27 LAB — BASIC METABOLIC PANEL
Anion gap: 10 (ref 5–15)
BUN: 18 mg/dL (ref 6–20)
CHLORIDE: 103 mmol/L (ref 101–111)
CO2: 27 mmol/L (ref 22–32)
CREATININE: 0.71 mg/dL (ref 0.44–1.00)
Calcium: 9.7 mg/dL (ref 8.9–10.3)
GFR calc non Af Amer: >60 mL/min (ref >60)
Glucose, Bld: 67 mg/dL (ref 65–99)
Potassium: 3.9 mmol/L (ref 3.5–5.1)
Sodium: 140 mmol/L (ref 135–145)

## 2015-08-27 NOTE — Patient Instructions (Addendum)
   Your procedure is scheduled on: January 27 (FRIDAY)  Enter through the Main Entrance of [facility] at: [place] up the phone at the desk and dial [phone] and inform us of your arrival.  Please call this number if you have any problems the morning of surgery: [phone]  Remember: Do not eat food after midnight: JAN 26 (THURSDAY) Do not drink clear liquids after: 8AM DAY OF SURGERY  Take these medicines the morning of surgery with a SIP OF WATER: TAKE BLOOD PRESSUE DAY OF SURGERY  Do not wear jewelry, make-up, or FINGER nail polish No metal in your hair or on your body. Do not wear lotions, powders, perfumes.  You may wear deodorant.  Do not bring valuables to the hospital. Contacts, dentures or bridgework may not be worn into surgery.   Patients discharged on the day of surgery will not be allowed to drive home.

## 2015-08-27 NOTE — Progress Notes (Signed)
   Subjective:    Patient ID: Amy Moyer, female    DOB: 05/02/1961, 55 y.o.   MRN: [id number]  HPI 55 yo lady who is here to see if her Word catheter fell out entirely. She is ready for her marsupialization procedure this Friday. All questions answered.   Review of Systems     Objective:   Physical Exam  WNWHBFNAD Breathing, conversing, and ambulating normally Vulva- nothing left of her Word catheter      Assessment & Plan:  Recurrent Bartholin's abscesses- plan for marsupialization this Friday

## 2015-08-31 ENCOUNTER — Encounter (HOSPITAL_COMMUNITY): Payer: Self-pay | Admitting: Anesthesiology

## 2015-08-31 ENCOUNTER — Ambulatory Visit (HOSPITAL_COMMUNITY): Payer: BC Managed Care – PPO | Admitting: Anesthesiology

## 2015-08-31 ENCOUNTER — Encounter (HOSPITAL_COMMUNITY)
Admission: RE | Disposition: A | Discharge status: Home / Self Care | Payer: Self-pay | Source: Ambulatory Visit | Attending: Obstetrics & Gynecology

## 2015-08-31 ENCOUNTER — Ambulatory Visit (HOSPITAL_COMMUNITY)
Admission: RE | Admit: 2015-08-31 | Discharge: 2015-08-31 | Disposition: A | Discharge status: Home / Self Care | Payer: BC Managed Care – PPO | Source: Ambulatory Visit | Attending: Obstetrics & Gynecology | Admitting: Obstetrics & Gynecology

## 2015-08-31 DIAGNOSIS — N750 Cyst of Bartholin's gland: Secondary | ICD-10-CM | POA: Diagnosis present

## 2015-08-31 HISTORY — PX: BARTHOLIN CYST MARSUPIALIZATION: SHX5383

## 2015-08-31 LAB — PREGNANCY, URINE: Preg Test, Ur: NEGATIVE

## 2015-08-31 SURGERY — MARSUPIALIZATION, CYST, BARTHOLIN'S GLAND
Anesthesia: General | Site: Perineum

## 2015-08-31 MED ORDER — HYDROMORPHONE HCL 1 MG/ML IJ SOLN
0.2500 mg | INTRAMUSCULAR | Status: DC | PRN
  Administered 2015-08-31: 0.5 mg via INTRAVENOUS

## 2015-08-31 MED ORDER — ONDANSETRON HCL 4 MG/2ML IJ SOLN
INTRAMUSCULAR | Status: DC | PRN
  Administered 2015-08-31: 4 mg via INTRAVENOUS

## 2015-08-31 MED ORDER — MIDAZOLAM HCL 2 MG/2ML IJ SOLN
INTRAMUSCULAR | Status: AC
  Filled 2015-08-31: qty 2

## 2015-08-31 MED ORDER — MIDAZOLAM HCL 2 MG/2ML IJ SOLN: 0.5000 mg | Freq: Once | INTRAMUSCULAR | Status: DC | PRN

## 2015-08-31 MED ORDER — SCOPOLAMINE 1 MG/3DAYS TD PT72
MEDICATED_PATCH | TRANSDERMAL | Status: AC
  Administered 2015-08-31: 1.5 mg via TRANSDERMAL
  Filled 2015-08-31: qty 1

## 2015-08-31 MED ORDER — LACTATED RINGERS IV SOLN
INTRAVENOUS | Status: DC
  Administered 2015-08-31: 12:00:00 via INTRAVENOUS

## 2015-08-31 MED ORDER — CEFAZOLIN SODIUM-DEXTROSE 2-3 GM-% IV SOLR
INTRAVENOUS | Status: AC
  Filled 2015-08-31: qty 50

## 2015-08-31 MED ORDER — FENTANYL CITRATE (PF) 100 MCG/2ML IJ SOLN
INTRAMUSCULAR | Status: DC | PRN
  Administered 2015-08-31 (×2): 50 ug via INTRAVENOUS
  Administered 2015-08-31: 100 ug via INTRAVENOUS

## 2015-08-31 MED ORDER — IBUPROFEN 600 MG PO TABS: 600.0000 mg | ORAL_TABLET | Freq: Four times a day (QID) | ORAL | Status: DC | PRN

## 2015-08-31 MED ORDER — SCOPOLAMINE 1 MG/3DAYS TD PT72
1.0000 | MEDICATED_PATCH | Freq: Once | TRANSDERMAL | Status: DC
  Administered 2015-08-31: 1.5 mg via TRANSDERMAL

## 2015-08-31 MED ORDER — BUPIVACAINE HCL (PF) 0.5 % IJ SOLN
INTRAMUSCULAR | Status: AC
  Filled 2015-08-31: qty 30

## 2015-08-31 MED ORDER — MIDAZOLAM HCL 2 MG/2ML IJ SOLN
INTRAMUSCULAR | Status: DC | PRN
  Administered 2015-08-31: 2 mg via INTRAVENOUS

## 2015-08-31 MED ORDER — BUPIVACAINE HCL (PF) 0.5 % IJ SOLN
INTRAMUSCULAR | Status: DC | PRN
  Administered 2015-08-31: 10 mL

## 2015-08-31 MED ORDER — DEXAMETHASONE SODIUM PHOSPHATE 10 MG/ML IJ SOLN
INTRAMUSCULAR | Status: DC | PRN
  Administered 2015-08-31: 4 mg via INTRAVENOUS

## 2015-08-31 MED ORDER — HYDROMORPHONE HCL 1 MG/ML IJ SOLN
INTRAMUSCULAR | Status: AC
  Filled 2015-08-31: qty 1

## 2015-08-31 MED ORDER — OXYCODONE-ACETAMINOPHEN 5-325 MG PO TABS: 1.0000 | ORAL_TABLET | Freq: Four times a day (QID) | ORAL | Status: DC | PRN

## 2015-08-31 MED ORDER — PROMETHAZINE HCL 25 MG/ML IJ SOLN
6.2500 mg | INTRAMUSCULAR | Status: DC | PRN
Start: 2015-08-31 — End: 2015-08-31

## 2015-08-31 MED ORDER — FENTANYL CITRATE (PF) 100 MCG/2ML IJ SOLN
INTRAMUSCULAR | Status: AC
  Filled 2015-08-31: qty 2

## 2015-08-31 MED ORDER — MEPERIDINE HCL 25 MG/ML IJ SOLN: 6.2500 mg | INTRAMUSCULAR | Status: DC | PRN

## 2015-08-31 MED ORDER — CEFAZOLIN SODIUM-DEXTROSE 2-3 GM-% IV SOLR
2.0000 g | INTRAVENOUS | Status: AC
  Administered 2015-08-31: 2 g via INTRAVENOUS

## 2015-08-31 SURGICAL SUPPLY — 28 items
BLADE SURG 11 STRL SS (BLADE) ×3 IMPLANT
CLOTH BEACON ORANGE TIMEOUT ST (SAFETY) ×3 IMPLANT
CONTAINER PREFILL 10% NBF 15ML (MISCELLANEOUS) ×3 IMPLANT
COUNTER NEEDLE 1200 MAGNETIC (NEEDLE) ×3 IMPLANT
ELECT REM PT RETURN 9FT ADLT (ELECTROSURGICAL)
ELECTRODE REM PT RTRN 9FT ADLT (ELECTROSURGICAL) IMPLANT
GAUZE PACKING 2X5 YD STRL (GAUZE/BANDAGES/DRESSINGS) ×3 IMPLANT
GLOVE BIO SURGEON STRL SZ 6.5 (GLOVE) ×2 IMPLANT
GLOVE BIO SURGEONS STRL SZ 6.5 (GLOVE) ×1
GLOVE BIOGEL PI IND STRL 7.0 (GLOVE) ×1 IMPLANT
GLOVE BIOGEL PI INDICATOR 7.0 (GLOVE) ×2
GOWN STRL REUS W/TWL LRG LVL3 (GOWN DISPOSABLE) ×6 IMPLANT
NEEDLE HYPO 22GX1.5 SAFETY (NEEDLE) ×3 IMPLANT
NS IRRIG 1000ML POUR BTL (IV SOLUTION) IMPLANT
PACK VAGINAL MINOR WOMEN LF (CUSTOM PROCEDURE TRAY) ×3 IMPLANT
PAD OB MATERNITY 4.3X12.25 (PERSONAL CARE ITEMS) ×3 IMPLANT
PAD PREP 24X48 CUFFED NSTRL (MISCELLANEOUS) ×3 IMPLANT
PENCIL BUTTON HOLSTER BLD 10FT (ELECTRODE) IMPLANT
SUT VIC AB 4-0 SH 18 (SUTURE) ×3 IMPLANT
SUT VICRYL 4-0 PS2 18IN ABS (SUTURE) ×3 IMPLANT
SWAB COLLECTION DEVICE MRSA (MISCELLANEOUS) IMPLANT
SWAB CULTURE ESWAB REG 1ML (MISCELLANEOUS) IMPLANT
SYR 20CC LL (SYRINGE) ×3 IMPLANT
TOWEL OR 17X24 6PK STRL BLUE (TOWEL DISPOSABLE) ×6 IMPLANT
TUBING NON-CON 1/4 X 20 CONN (TUBING) IMPLANT
TUBING NON-CON 1/4 X 20' CONN (TUBING)
WATER STERILE IRR 1000ML POUR (IV SOLUTION) IMPLANT
YANKAUER SUCT BULB TIP NO VENT (SUCTIONS) IMPLANT

## 2015-08-31 NOTE — Op Note (Signed)
08/31/2015  2:08 PM  PATIENT:  Amy Moyer  55 y.o. female  PRE-OPERATIVE DIAGNOSIS:  Recurrent left Bartholin's cyst  POST-OPERATIVE DIAGNOSIS:  same  PROCEDURE:  Procedure(s): BARTHOLIN CYST MARSUPIALIZATION Left (N/A)  SURGEON:  Surgeon(s) and Role:    * Emily Filbert, MD - Primary  PHYSICIAN ASSISTANT:   ASSISTANTS: none   ANESTHESIA:   general  EBL:   minimal  BLOOD ADMINISTERED:none  DRAINS: none   LOCAL MEDICATIONS USED:  MARCAINE     SPECIMEN:  No Specimen  DISPOSITION OF SPECIMEN:  PATHOLOGY  COUNTS:  YES  TOURNIQUET:  * No tourniquets in log *  DICTATION: .Dragon Dictation  PLAN OF CARE: Discharge to home after PACU  PATIENT DISPOSITION:  PACU - hemodynamically stable.   Delay start of Pharmacological VTE agent (>24hrs) due to surgical blood loss or risk of bleeding: not applicable The risks, benefits, and alternatives of surgery were explained, understood, and accepted. The consent form was signed. All questions were answered. She was taken to operating room placed in dorsal lithotomy position. When she was comfortable general anesthesia was applied without complication. Her vagina was prepped and draped in usual sterile fashion. Timeout procedure was donethe left Bartholin's cyst was identified and approximally 5 cc of 0.5%Marcaine was injected into this area and made a 1 cm vertical incision inside of the labia minora into the cyst. A large amount of pus fluid came out. I grabbed the edges of the cyst wall after probing the cyst for any further pockets. I did approximately 8 interrupted 4-0 Vicryl sutures, attaching the cyst wall to the vulvar skin. I then could feel an another firmness anterior to this I took a 18-gauge needle and attempted to withdraw any fluid that might be in this firm area. No fluid was noted. I am figuring that this is just induration from her previous infection. Excellent hemostasis was noted. I packed her vagina with plain gauze to  provided And not effect on the cyst wall to prevent any bleeding.She was extubated and taken to recovery room in stable condition.

## 2015-08-31 NOTE — Anesthesia Postprocedure Evaluation (Signed)
Anesthesia Post Note  Patient: Amy Moyer  Procedure(s) Performed: Procedure(s) (LRB): BARTHOLIN CYST MARSUPIALIZATION Left (N/A)  Patient location during evaluation: PACU Anesthesia Type: General Level of consciousness: awake Pain management: pain level controlled Vital Signs Assessment: post-procedure vital signs reviewed and stable Respiratory status: spontaneous breathing Cardiovascular status: stable Postop Assessment: no signs of nausea or vomiting Anesthetic complications: no    Last Vitals:  Filed Vitals:   08/31/15 1421 08/31/15 1430  BP: 132/83 139/90  Pulse: 76 78  Temp: 36.7 C   Resp: 12 19    Last Pain: There were no vitals filed for this visit.               [place]

## 2015-08-31 NOTE — H&P (Signed)
Amy Moyer is an 55 y.o. M AA lady here for marsupializtion of her left Bartholin's gland. She has had several I&D procedures.    No LMP recorded. Patient has had a hysterectomy.    Past Medical History  Diagnosis Date  . Hypertension   . Sepsis affecting skin   . Infected cyst of Bartholin's gland duct   . Hemorrhoids   . Uterine fibroid   . History of hiatal hernia     Past Surgical History  Procedure Laterality Date  . Abdominal hysterectomy  2005  . Cesarean section    . Appendectomy    . Diagnostic laparoscopy      Family History  Problem Relation Age of Onset  . Hypertension Mother   . Diabetes Mother     Social History:  reports that she has never smoked. She has never used smokeless tobacco. She reports that she drinks alcohol. She reports that she does not use illicit drugs.  Allergies:  Allergies  Allergen Reactions  . Ace Inhibitors Cough    Prescriptions prior to admission  Medication Sig Dispense Refill Last Dose  . magnesium oxide (MAG-OX) 400 MG tablet Take 2 tablets (800 mg total) by mouth at bedtime. 90 tablet 3 Taking  . meloxicam (MOBIC) 15 MG tablet One tab PO qAM with breakfast for 2 weeks, then daily prn pain. (Patient taking differently: Take 15 mg by mouth daily as needed for pain. ) 30 tablet 3 Taking  . sertraline (ZOLOFT) 50 MG tablet Take 50 mg by mouth daily.    Taking  . valsartan (DIOVAN) 160 MG tablet Take 1 tablet (160 mg total) by mouth daily. 30 tablet 1 08/30/2015 at 2200  . Vitamin D, Ergocalciferol, (DRISDOL) 50000 UNITS CAPS capsule Take 50,000 Units by mouth daily.    Taking    ROS  Blood pressure 142/87, pulse 73, temperature 98.6 F (37 C), temperature source Oral, resp. rate 20, SpO2 100 %. Physical Exam  Heart- rrr Lungs- CTAB Abd- benign  Results for orders placed or performed during the hospital encounter of 08/31/15 (from the past 24 hour(s))  Pregnancy, urine     Status: None   Collection Time: 08/31/15 11:22  AM  Result Value Ref Range   Preg Test, Ur NEGATIVE NEGATIVE    No results found.  Assessment/Plan: Recurrent left Bartholin's abscess- plan for marsupialization. She is aware that surgery has risks of infection, bleeding.  [name] C. 08/31/2015, 1:21 PM

## 2015-08-31 NOTE — Anesthesia Procedure Notes (Signed)
Procedure Name: LMA Insertion Date/Time: 08/31/2015 1:43 PM Performed by: Manasa Spease, Sheron Nightingale Pre-anesthesia Checklist: Patient identified, Timeout performed, Emergency Drugs available, Suction available and Patient being monitored Patient Re-evaluated:Patient Re-evaluated prior to inductionOxygen Delivery Method: Circle system utilized Preoxygenation: Pre-oxygenation with 100% oxygen Intubation Type: IV induction LMA: LMA inserted LMA Size: 4.0 Number of attempts: 1 Placement Confirmation: breath sounds checked- equal and bilateral Dental Injury: Teeth and Oropharynx as per pre-operative assessment

## 2015-08-31 NOTE — Transfer of Care (Signed)
Immediate Anesthesia Transfer of Care Note  Patient: Amy Moyer  Procedure(s) Performed: Procedure(s): BARTHOLIN CYST MARSUPIALIZATION Left (N/A)  Patient Location: PACU  Anesthesia Type:General  Level of Consciousness: awake, alert  and oriented  Airway & Oxygen Therapy: Patient Spontanous Breathing and Patient connected to nasal cannula oxygen  Post-op Assessment: Report given to RN and Post -op Vital signs reviewed and stable  Post vital signs: Reviewed and stable  Last Vitals:  Filed Vitals:   08/31/15 1134  BP: 142/87  Pulse: 73  Temp: 37 C  Resp: 20    Complications: No apparent anesthesia complications

## 2015-08-31 NOTE — Discharge Instructions (Addendum)
°  Post Anesthesia Home Care Instructions  Activity: Get plenty of rest for the remainder of the day. A responsible adult should stay with you for 24 hours following the procedure.  For the next 24 hours, DO NOT: -Drive a car -Operate machinery -Drink alcoholic beverages -Take any medication unless instructed by your physician -Make any legal decisions or sign important papers.  Meals: Start with liquid foods such as gelatin or soup. Progress to regular foods as tolerated. Avoid greasy, spicy, heavy foods. If nausea and/or vomiting occur, drink only clear liquids until the nausea and/or vomiting subsides. Call your physician if vomiting continues.  Special Instructions/Symptoms: Your throat may feel dry or sore from the anesthesia or the breathing tube placed in your throat during surgery. If this causes discomfort, gargle with warm salt water. The discomfort should disappear within 24 hours.  If you had a scopolamine patch placed behind your ear for the management of post- operative nausea and/or vomiting:  1. The medication in the patch is effective for 72 hours, after which it should be removed.  Wrap patch in a tissue and discard in the trash. Wash hands thoroughly with soap and water. 2. You may remove the patch earlier than 72 hours if you experience unpleasant side effects which may include dry mouth, dizziness or visual disturbances. 3. Avoid touching the patch. Wash your hands with soap and water after contact with the patch.    Post Anesthesia Home Care Instructions  Activity: Get plenty of rest for the remainder of the day. A responsible adult should stay with you for 24 hours following the procedure.  For the next 24 hours, DO NOT: -Drive a car -Operate machinery -Drink alcoholic beverages -Take any medication unless instructed by your physician -Make any legal decisions or sign important papers.  Meals: Start with liquid foods such as gelatin or soup. Progress to  regular foods as tolerated. Avoid greasy, spicy, heavy foods. If nausea and/or vomiting occur, drink only clear liquids until the nausea and/or vomiting subsides. Call your physician if vomiting continues.  Special Instructions/Symptoms: Your throat may feel dry or sore from the anesthesia or the breathing tube placed in your throat during surgery. If this causes discomfort, gargle with warm salt water. The discomfort should disappear within 24 hours.  If you had a scopolamine patch placed behind your ear for the management of post- operative nausea and/or vomiting:  1. The medication in the patch is effective for 72 hours, after which it should be removed.  Wrap patch in a tissue and discard in the trash. Wash hands thoroughly with soap and water. 2. You may remove the patch earlier than 72 hours if you experience unpleasant side effects which may include dry mouth, dizziness or visual disturbances. 3. Avoid touching the patch. Wash your hands with soap and water after contact with the patch.    

## 2015-08-31 NOTE — Anesthesia Preprocedure Evaluation (Addendum)
Anesthesia Evaluation  Patient identified by MRN, date of birth, ID band Patient awake    Reviewed: Allergy & Precautions, H&P , NPO status , Patient's Chart, lab work & pertinent test results, reviewed documented beta blocker date and time   Airway Mallampati: I  TM Distance: >3 FB Neck ROM: full    Dental no notable dental hx. (+) Teeth Intact   Pulmonary neg pulmonary ROS,    Pulmonary exam normal        Cardiovascular hypertension, Pt. on medications Normal cardiovascular exam  '16 ECHO: EF 60-65%, valves OK   Neuro/Psych Depression negative psych ROS   GI/Hepatic Neg liver ROS,   Endo/Other  negative endocrine ROS  Renal/GU negative Renal ROS     Musculoskeletal   Abdominal Normal abdominal exam  (+)   Peds  Hematology negative hematology ROS (+)   Anesthesia Other Findings   Reproductive/Obstetrics negative OB ROS                             Anesthesia Physical Anesthesia Plan  ASA: II  Anesthesia Plan: General   Post-op Pain Management:    Induction: Intravenous  Airway Management Planned: LMA  Additional Equipment:   Intra-op Plan:   Post-operative Plan: Extubation in OR  Informed Consent: I have reviewed the patients History and Physical, chart, labs and discussed the procedure including the risks, benefits and alternatives for the proposed anesthesia with the patient or authorized representative who has indicated his/her understanding and acceptance.   Dental Advisory Given  Plan Discussed with: CRNA and Surgeon  Anesthesia Plan Comments:         Anesthesia Quick Evaluation

## 2015-09-03 ENCOUNTER — Encounter (HOSPITAL_COMMUNITY): Payer: Self-pay | Admitting: Obstetrics & Gynecology

## 2015-09-03 ENCOUNTER — Encounter: Payer: Self-pay | Admitting: Sports Medicine

## 2015-09-03 ENCOUNTER — Ambulatory Visit (INDEPENDENT_AMBULATORY_CARE_PROVIDER_SITE_OTHER): Payer: BC Managed Care – PPO | Admitting: Sports Medicine

## 2015-09-03 VITALS — BP 154/87 | HR 63 | Temp 98.2°F | Resp 18 | Wt 175.9 lb

## 2015-09-03 DIAGNOSIS — N951 Menopausal and female climacteric states: Secondary | ICD-10-CM

## 2015-09-03 DIAGNOSIS — M7712 Lateral epicondylitis, left elbow: Secondary | ICD-10-CM | POA: Diagnosis not present

## 2015-09-03 DIAGNOSIS — J0100 Acute maxillary sinusitis, unspecified: Secondary | ICD-10-CM | POA: Diagnosis not present

## 2015-09-03 DIAGNOSIS — G4719 Other hypersomnia: Secondary | ICD-10-CM

## 2015-09-03 MED ORDER — FLUTICASONE PROPIONATE 50 MCG/ACT NA SUSP: NASAL | Status: AC

## 2015-09-03 MED ORDER — AMOXICILLIN-POT CLAVULANATE 875-125 MG PO TABS: 1.0000 | ORAL_TABLET | Freq: Two times a day (BID) | ORAL | Status: DC

## 2015-09-03 MED ORDER — SERTRALINE HCL 50 MG PO TABS: 50.0000 mg | ORAL_TABLET | Freq: Every day | ORAL | Status: DC

## 2015-09-03 NOTE — Assessment & Plan Note (Signed)
Rehabilitation exercises, return in one month for injection if no better

## 2015-09-03 NOTE — Assessment & Plan Note (Signed)
Augmentin, Flonase. 

## 2015-09-03 NOTE — Progress Notes (Signed)
  Subjective:    CC: "I think I have a sinus infection"  HPI: Patient presents with a 3 week history of nasal congestion, mildly productive cough, sore throat, and frontal pressure. 3 weeks ago she went to her dentist to get a filling and since then has had some pain in the area associated with nasal congestion that is "green or yellow" when she blew her nose. She says that the color changed over the past couple days to white. Patient's cough is only mildly productive of green-yellow fluid as well. She has a history of sinus infection and says that this feels like her previous sinus infections. She denies fevers, headaches, visual changes, ear pain, or SOB.  Patient also complains of left elbow pain. It has been going on for the pats few weeks and is worse when carrying things. She rates the pain as mild when not lifting things and says that it has been getting better overall with rest. She denies any preceding trauma, event, numbness, tingling, or weakness.  Patient also complains of worsening hot flashes and asks if there is anything that can be done for that. She says that she routinely keeps the house very cold and this has bothered her husband. She reports feeling some anxiety and recently restarting her Sertraline after running out. She is unsure if the hot flashes got worse after running out of her SSRI.  Past medical history, Surgical history, Family history not pertinant except as noted below, Social history, Allergies, and medications have been entered into the medical record, reviewed, and no changes needed.   Review of Systems: No fevers, chills, night sweats, weight loss, chest pain, or shortness of breath.   Objective:    General: Well Developed, well nourished, and in no acute distress.  Neuro: Alert and oriented x3, extra-ocular muscles intact, sensation grossly intact.  HEENT: Normocephalic, atraumatic, pupils equal round reactive to light, neck supple, no masses, no  lymphadenopathy, thyroid nonpalpable, Ear canals are clear bilaterally and TMs are without bulging or erythema, nares crusted bilaterally, no obvious lesions noted along the gum line. Skin: Warm and dry, no rashes. Cardiac: Regular rate and rhythm, no murmurs rubs or gallops, no lower extremity edema.  Respiratory: Clear to auscultation bilaterally. Not using accessory muscles, speaking in full sentences. Left Elbow: Unremarkable to inspection. Range of motion full pronation, supination, flexion, extension. Strength is full to all of the above directions Stable to varus, valgus stress. Negative moving valgus stress test. Tenderness over the lateral epicondyle. Ulnar nerve does not sublux. Negative cubital tunnel Tinel's.   Impression and Recommendations:    Patient most likely has bacterial sinusitis that arose post viral sinusitis given recent change to white discharge and frontal pressure. Severity of frontal ethmoid tenderness concerning for occult etiology such as abscess or dental erosion. Will Rx Augmentin and instructed patient to return to clinic if it worsens or does not get better in a 2-3 days. Will consider CT if patient does not get relief from antibiotic.  Patient's elbow pain is most likely lateral epicondylitis. Will give exercises to do at home and take OTC NSAIDs/Tylenol as needed. Return to clinic in 1 month to F/U.  Patient in agreement to monitor hot flashes now that she is taking her Zoloft and return in 1 month to F/U.

## 2015-09-03 NOTE — Assessment & Plan Note (Signed)
Predominantly with vasomotor instability, starting Zoloft 50. This will also help with her generalized anxiety

## 2015-09-13 ENCOUNTER — Encounter: Payer: Self-pay | Admitting: Sports Medicine

## 2015-09-24 ENCOUNTER — Encounter: Payer: Self-pay | Admitting: Obstetrics & Gynecology

## 2015-09-24 ENCOUNTER — Ambulatory Visit (INDEPENDENT_AMBULATORY_CARE_PROVIDER_SITE_OTHER): Payer: BC Managed Care – PPO | Admitting: Obstetrics & Gynecology

## 2015-09-24 VITALS — BP 144/79 | HR 61 | Temp 97.4°F | Resp 16 | Ht 67.0 in | Wt 180.0 lb

## 2015-09-24 DIAGNOSIS — Z9889 Other specified postprocedural states: Secondary | ICD-10-CM

## 2015-09-24 NOTE — Progress Notes (Signed)
   Subjective:    Patient ID: Amy Moyer, female    DOB: 1961-02-07, 56 y.o.   MRN: [id number]  HPI 55 yo M AA lady here about 3 1/2 weeks after marsupializtion of her left Bartholin's due to recurrent abscesses on that side. She is having no complaints, back to riding her bike.   Review of Systems     Objective:   Physical Exam  [id number] A few of her stitches are still present. I feel a very deep cyst, non-tender      Assessment & Plan:  Post op- doing well We discussed the new onset deep cyst.  I have told her that if it grows or bothers her I will refer her to Dr. [place] Pink

## 2015-09-25 ENCOUNTER — Other Ambulatory Visit: Payer: Self-pay

## 2015-09-25 DIAGNOSIS — I10 Essential (primary) hypertension: Secondary | ICD-10-CM

## 2015-09-25 MED ORDER — VALSARTAN 160 MG PO TABS: 160.0000 mg | ORAL_TABLET | Freq: Every day | ORAL | Status: DC

## 2015-10-02 ENCOUNTER — Ambulatory Visit: Payer: BC Managed Care – PPO | Admitting: Sports Medicine

## 2015-10-05 ENCOUNTER — Encounter: Payer: Self-pay | Admitting: Sports Medicine

## 2015-10-05 MED ORDER — VITAMIN B-12 1000 MCG PO TABS: 1000.0000 ug | ORAL_TABLET | Freq: Every day | ORAL | Status: AC

## 2015-10-05 MED ORDER — FERROUS SULFATE 325 (65 FE) MG PO TBEC
325.0000 mg | DELAYED_RELEASE_TABLET | Freq: Every day | ORAL | Status: AC
Start: 2015-10-05 — End: ?

## 2015-10-11 ENCOUNTER — Encounter (HOSPITAL_BASED_OUTPATIENT_CLINIC_OR_DEPARTMENT_OTHER): Payer: Self-pay | Admitting: *Deleted

## 2015-10-13 ENCOUNTER — Encounter: Payer: Self-pay | Admitting: Sports Medicine

## 2015-10-13 ENCOUNTER — Encounter: Payer: Self-pay | Admitting: Obstetrics & Gynecology

## 2015-10-15 ENCOUNTER — Telehealth: Payer: Self-pay | Admitting: *Deleted

## 2015-10-15 DIAGNOSIS — N750 Cyst of Bartholin's gland: Secondary | ICD-10-CM

## 2015-10-15 NOTE — Telephone Encounter (Signed)
Pt called wanting a referral to [place] Pink for continue pain in deep bartholin gland per Dr Alease Medina previous note

## 2015-10-19 ENCOUNTER — Ambulatory Visit (INDEPENDENT_AMBULATORY_CARE_PROVIDER_SITE_OTHER): Payer: BC Managed Care – PPO

## 2015-10-19 ENCOUNTER — Encounter: Payer: Self-pay | Admitting: Sports Medicine

## 2015-10-19 ENCOUNTER — Ambulatory Visit (INDEPENDENT_AMBULATORY_CARE_PROVIDER_SITE_OTHER): Payer: BC Managed Care – PPO | Admitting: Sports Medicine

## 2015-10-19 DIAGNOSIS — S86892A Other injury of other muscle(s) and tendon(s) at lower leg level, left leg, initial encounter: Secondary | ICD-10-CM | POA: Diagnosis not present

## 2015-10-19 DIAGNOSIS — M79661 Pain in right lower leg: Secondary | ICD-10-CM

## 2015-10-19 DIAGNOSIS — M79662 Pain in left lower leg: Secondary | ICD-10-CM

## 2015-10-19 MED ORDER — FUROSEMIDE 40 MG PO TABS: 40.0000 mg | ORAL_TABLET | Freq: Every day | ORAL | Status: AC

## 2015-10-19 MED ORDER — POTASSIUM CHLORIDE CRYS ER 20 MEQ PO TBCR: 40.0000 meq | EXTENDED_RELEASE_TABLET | Freq: Every day | ORAL | Status: AC

## 2015-10-19 NOTE — Assessment & Plan Note (Signed)
Bilateral medial tibial pain, despite custom orthotics. She also has significant swelling with 1+ pitting edema. She does have a negative echocardiogram from last year, normal renal and hepatic function from this year and a urinalysis without proteinuria. We are going to add 40 mg of Lasix for one week as well as bilateral air casts. X-rays of both shins. Return to see me in 2 weeks.

## 2015-10-19 NOTE — Progress Notes (Signed)
  Subjective:    CC:  Bilateral leg pain  HPI: This is a pleasant 55 year old female runner, for sometime she's had bilateral shin pain, we have tried multiple modalities including custom orthotics, but she continues to have pain that she localizes diffusely along the medial tibial border. On further questioning she has noted a 20-30 pound increase in her weight as well as significant swelling of both of her legs. Symptoms are severe, persistent.  Past medical history, Surgical history, Family history not pertinant except as noted below, Social history, Allergies, and medications have been entered into the medical record, reviewed, and no changes needed.   Review of Systems: No fevers, chills, night sweats, weight loss, chest pain, or shortness of breath.   Objective:    General: Well Developed, well nourished, and in no acute distress.  Neuro: Alert and oriented x3, extra-ocular muscles intact, sensation grossly intact.  HEENT: Normocephalic, atraumatic, pupils equal round reactive to light, neck supple, no masses, no lymphadenopathy, thyroid nonpalpable.  Skin: Warm and dry, no rashes. Cardiac: Regular rate and rhythm, no murmurs rubs or gallops, no lower extremity edema.  Respiratory: Clear to auscultation bilaterally. Not using accessory muscles, speaking in full sentences. Legs: Visible 1-2+ pitting edema with tenderness, there is also diffuse tenderness along the entire medial tibial border. Neurovascularly intact distally.  Impression and Recommendations:

## 2015-10-19 NOTE — Addendum Note (Signed)
Addended by: Elizabeth Sauer on: 10/19/2015 04:14 PM   Modules accepted: Medications

## 2015-10-20 ENCOUNTER — Ambulatory Visit (HOSPITAL_BASED_OUTPATIENT_CLINIC_OR_DEPARTMENT_OTHER): Payer: BC Managed Care – PPO | Attending: Sports Medicine

## 2015-10-20 VITALS — Ht 66.0 in | Wt 174.0 lb

## 2015-10-20 DIAGNOSIS — G4700 Insomnia, unspecified: Secondary | ICD-10-CM | POA: Diagnosis not present

## 2015-10-20 DIAGNOSIS — G4719 Other hypersomnia: Secondary | ICD-10-CM | POA: Diagnosis present

## 2015-10-20 DIAGNOSIS — R0683 Snoring: Secondary | ICD-10-CM | POA: Diagnosis not present

## 2015-10-26 ENCOUNTER — Ambulatory Visit: Payer: BC Managed Care – PPO | Admitting: Sports Medicine

## 2015-10-27 DIAGNOSIS — G4719 Other hypersomnia: Secondary | ICD-10-CM | POA: Diagnosis not present

## 2015-10-27 NOTE — Progress Notes (Signed)
   Patient Name: Amy Moyer, Amy Moyer Date: 10/20/2015 Gender: Female D.O.B: Jun 15, 1961 Age (years): 55 Referring Provider: Gwen Her Thekkekandam Height (inches): 67 Interpreting Physician: Baird Lyons MD, ABSM Weight (lbs): 175 RPSGT: Madelon Lips BMI: 27 MRN: [id number] Neck Size: 15.50 CLINICAL INFORMATION Sleep Study Type: NPSG Indication for sleep study: Excessive Daytime Sleepiness Epworth Sleepiness Score:11/24  SLEEP STUDY TECHNIQUE As per the AASM Manual for the Scoring of Sleep and Associated Events v2.3 (April 2016) with a hypopnea requiring 4% desaturations. The channels recorded and monitored were frontal, central and occipital EEG, electrooculogram (EOG), submentalis EMG (chin), nasal and oral airflow, thoracic and abdominal wall motion, anterior tibialis EMG, snore microphone, electrocardiogram, and pulse oximetry.  MEDICATIONS Patient's medications include: charted for review Medications self-administered by patient during sleep study : No sleep medicine administered.  SLEEP ARCHITECTURE The study was initiated at 10:10:07 PM and ended at 4:16:22 AM. Sleep onset time was 61.6 minutes and the sleep efficiency was 56.8%. The total sleep time was 208.0 minutes. Stage REM latency was 177.0 minutes. The patient spent 12.98% of the night in stage N1 sleep, 74.04% in stage N2 sleep, 0.24% in stage N3 and 12.74% in REM. Alpha intrusion was absent. Supine sleep was 24.04%.  RESPIRATORY PARAMETERS The overall apnea/hypopnea index (AHI) was 0.0 per hour. There were 0 total apneas, including 0 obstructive, 0 central and 0 mixed apneas. There were 0 hypopneas and 7 RERAs. The AHI during Stage REM sleep was 0.0 per hour. AHI while supine was 0.0 per hour. The mean oxygen saturation was 95.89%. The minimum SpO2 during sleep was 93.00%. Loud snoring was noted during this study.  CARDIAC DATA The 2 lead EKG demonstrated sinus rhythm. The mean heart rate was 55.75 beats  per minute. Other EKG findings include: None.  LEG MOVEMENT DATA The total PLMS were 0 with a resulting PLMS index of 0.00. Associated arousal with leg movement index was 0.0 .  IMPRESSIONS - No significant obstructive sleep apnea occurred during this study (AHI = 0.0/h). - No significant central sleep apnea occurred during this study (CAI = 0.0/h). - The patient had minimal or no oxygen desaturation during the study (Min O2 = 93.00%) - The patient snored with Loud snoring volume. - No cardiac abnormalities were noted during this study. - Clinically significant periodic limb movements did not occur during sleep. No significant associated arousals. - Difficulty initiating and maintaining sleep- insomnia  DIAGNOSIS - Primary Snoring (786.09 [R06.83 ICD-10]) - Difficulty initiating and maintaining sleep- insomnia  RECOMMENDATIONS - Avoid alcohol, sedatives and other CNS depressants that may worsen sleep apnea and disrupt normal sleep architecture. - Sleep hygiene should be reviewed to assess factors that may improve sleep quality. - Weight management and regular exercise should be initiated or continued if appropriate.  Deneise Lever Diplomate, American Board of Sleep Medicine  ELECTRONICALLY SIGNED ON:  10/27/2015, 11:19 AM [place] PH: [phone]   FX: [phone] [place]

## 2015-11-01 ENCOUNTER — Ambulatory Visit: Payer: BC Managed Care – PPO | Admitting: Sports Medicine

## 2015-11-04 ENCOUNTER — Ambulatory Visit (HOSPITAL_BASED_OUTPATIENT_CLINIC_OR_DEPARTMENT_OTHER): Payer: BC Managed Care – PPO

## 2015-11-12 ENCOUNTER — Encounter: Payer: Self-pay | Admitting: *Deleted

## 2015-11-14 ENCOUNTER — Encounter: Payer: Self-pay | Admitting: Obstetrics & Gynecology

## 2015-11-23 ENCOUNTER — Encounter: Payer: Self-pay | Admitting: Sports Medicine

## 2015-11-23 ENCOUNTER — Ambulatory Visit (INDEPENDENT_AMBULATORY_CARE_PROVIDER_SITE_OTHER): Payer: BC Managed Care – PPO | Admitting: Sports Medicine

## 2015-11-23 VITALS — BP 131/79 | HR 62 | Resp 18 | Wt 179.8 lb

## 2015-11-23 DIAGNOSIS — M79661 Pain in right lower leg: Secondary | ICD-10-CM

## 2015-11-23 DIAGNOSIS — R635 Abnormal weight gain: Secondary | ICD-10-CM | POA: Diagnosis not present

## 2015-11-23 DIAGNOSIS — G478 Other sleep disorders: Secondary | ICD-10-CM | POA: Diagnosis not present

## 2015-11-23 DIAGNOSIS — E663 Overweight: Secondary | ICD-10-CM | POA: Insufficient documentation

## 2015-11-23 MED ORDER — PHENTERMINE HCL 37.5 MG PO TABS: ORAL_TABLET | ORAL | Status: DC

## 2015-11-23 MED ORDER — SUVOREXANT 10 MG PO TABS: 1.0000 | ORAL_TABLET | Freq: Every day | ORAL | Status: DC
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
## 2015-11-23 NOTE — Assessment & Plan Note (Signed)
Sleep study showed very little time in stage III and 4 sleep, without evidence of sleep apnea. We are going to start Spivey. Return in one month.

## 2015-11-23 NOTE — Assessment & Plan Note (Signed)
Starting phentermine, return monthly for weight checks and refills. 

## 2015-11-27 ENCOUNTER — Other Ambulatory Visit: Payer: Self-pay

## 2015-11-27 DIAGNOSIS — I10 Essential (primary) hypertension: Secondary | ICD-10-CM

## 2015-11-27 MED ORDER — VALSARTAN 160 MG PO TABS: 160.0000 mg | ORAL_TABLET | Freq: Every day | ORAL | Status: AC

## 2015-12-13 ENCOUNTER — Other Ambulatory Visit: Payer: Self-pay | Admitting: Sports Medicine

## 2015-12-13 DIAGNOSIS — G478 Other sleep disorders: Secondary | ICD-10-CM

## 2015-12-13 MED ORDER — SUVOREXANT 10 MG PO TABS: 1.0000 | ORAL_TABLET | Freq: Every day | ORAL | Status: DC

## 2015-12-14 ENCOUNTER — Telehealth: Payer: Self-pay | Admitting: *Deleted

## 2015-12-14 NOTE — Telephone Encounter (Signed)
PA initiated through covermymeds Key: [id number] - PA Case ID: [id number] Need help? Call us at [phone]  Status

## 2015-12-17 NOTE — Telephone Encounter (Signed)
Insurance denied coverage for Belsomra stating the patient has to have tried and failed either zolpidem or temazepam. Denial letter place in provider's box

## 2015-12-21 ENCOUNTER — Ambulatory Visit (INDEPENDENT_AMBULATORY_CARE_PROVIDER_SITE_OTHER): Payer: BC Managed Care – PPO | Admitting: Sports Medicine

## 2015-12-21 ENCOUNTER — Encounter: Payer: Self-pay | Admitting: Sports Medicine

## 2015-12-21 VITALS — BP 131/80 | HR 70 | Resp 16 | Wt 172.2 lb

## 2015-12-21 DIAGNOSIS — R635 Abnormal weight gain: Secondary | ICD-10-CM | POA: Diagnosis not present

## 2015-12-21 DIAGNOSIS — G478 Other sleep disorders: Secondary | ICD-10-CM

## 2015-12-21 MED ORDER — PHENTERMINE HCL 37.5 MG PO TABS: ORAL_TABLET | ORAL | Status: DC

## 2015-12-21 NOTE — Assessment & Plan Note (Signed)
Resolved with melatonin over-the-counter.

## 2015-12-21 NOTE — Assessment & Plan Note (Signed)
Moderate weight loss after the first month of phentermine, entering the second month.

## 2015-12-21 NOTE — Progress Notes (Signed)
  Subjective:    CC: Follow-up  HPI: Abnormal weight gain: 7 pound weight loss over the last month.  Insomnia: Never got Belsomra but melatonin over-the-counter work just as well.  Past medical history, Surgical history, Family history not pertinant except as noted below, Social history, Allergies, and medications have been entered into the medical record, reviewed, and no changes needed.   Review of Systems: No fevers, chills, night sweats, weight loss, chest pain, or shortness of breath.   Objective:    General: Well Developed, well nourished, and in no acute distress.  Neuro: Alert and oriented x3, extra-ocular muscles intact, sensation grossly intact.  HEENT: Normocephalic, atraumatic, pupils equal round reactive to light, neck supple, no masses, no lymphadenopathy, thyroid nonpalpable.  Skin: Warm and dry, no rashes. Cardiac: Regular rate and rhythm, no murmurs rubs or gallops, no lower extremity edema.  Respiratory: Clear to auscultation bilaterally. Not using accessory muscles, speaking in full sentences.  Impression and Recommendations:

## 2015-12-25 ENCOUNTER — Encounter: Payer: Self-pay | Admitting: Sports Medicine

## 2015-12-25 MED ORDER — IBUPROFEN 600 MG PO TABS: 600.0000 mg | ORAL_TABLET | Freq: Three times a day (TID) | ORAL | Status: DC | PRN

## 2016-01-21 ENCOUNTER — Ambulatory Visit: Payer: BC Managed Care – PPO | Admitting: Sports Medicine

## 2016-01-29 ENCOUNTER — Ambulatory Visit (INDEPENDENT_AMBULATORY_CARE_PROVIDER_SITE_OTHER): Payer: BC Managed Care – PPO | Admitting: Sports Medicine

## 2016-01-29 VITALS — BP 130/72 | HR 65 | Resp 18 | Wt 164.9 lb

## 2016-01-29 DIAGNOSIS — R635 Abnormal weight gain: Secondary | ICD-10-CM

## 2016-01-29 MED ORDER — PHENTERMINE HCL 37.5 MG PO TABS: ORAL_TABLET | ORAL | Status: DC

## 2016-01-29 NOTE — Progress Notes (Signed)
  Subjective:    CC: Weight check  HPI: This is a pleasant 55 year old female, she's continued to lose weight after 2 months of phentermine. No adverse effects.  Past medical history, Surgical history, Family history not pertinant except as noted below, Social history, Allergies, and medications have been entered into the medical record, reviewed, and no changes needed.   Review of Systems: No fevers, chills, night sweats, weight loss, chest pain, or shortness of breath.   Objective:    General: Well Developed, well nourished, and in no acute distress.  Neuro: Alert and oriented x3, extra-ocular muscles intact, sensation grossly intact.  HEENT: Normocephalic, atraumatic, pupils equal round reactive to light, neck supple, no masses, no lymphadenopathy, thyroid nonpalpable.  Skin: Warm and dry, no rashes. Cardiac: Regular rate and rhythm, no murmurs rubs or gallops, no lower extremity edema.  Respiratory: Clear to auscultation bilaterally. Not using accessory muscles, speaking in full sentences.  Impression and Recommendations:

## 2016-01-29 NOTE — Assessment & Plan Note (Signed)
Good continued weight loss after the second month, refilling medication.

## 2016-02-26 ENCOUNTER — Ambulatory Visit (INDEPENDENT_AMBULATORY_CARE_PROVIDER_SITE_OTHER): Payer: BC Managed Care – PPO | Admitting: Sports Medicine

## 2016-02-26 ENCOUNTER — Encounter: Payer: Self-pay | Admitting: Sports Medicine

## 2016-02-26 DIAGNOSIS — R635 Abnormal weight gain: Secondary | ICD-10-CM

## 2016-02-26 MED ORDER — PHENTERMINE HCL 37.5 MG PO TABS: ORAL_TABLET | ORAL | 0 refills | Status: DC

## 2016-02-26 NOTE — Assessment & Plan Note (Signed)
Weight loss plateaued after 3rd month. Refilling medication, adding topamax next month if insufficient weight loss. About to go on vacation.

## 2016-02-26 NOTE — Progress Notes (Signed)
  Subjective:    CC: Follow-up  HPI: Obesity: Good continued weight loss, no side effects.  Past medical history, Surgical history, Family history not pertinant except as noted below, Social history, Allergies, and medications have been entered into the medical record, reviewed, and no changes needed.   Review of Systems: No fevers, chills, night sweats, weight loss, chest pain, or shortness of breath.   Objective:    General: Well Developed, well nourished, and in no acute distress.  Neuro: Alert and oriented x3, extra-ocular muscles intact, sensation grossly intact.  HEENT: Normocephalic, atraumatic, pupils equal round reactive to light, neck supple, no masses, no lymphadenopathy, thyroid nonpalpable.  Skin: Warm and dry, no rashes. Cardiac: Regular rate and rhythm, no murmurs rubs or gallops, no lower extremity edema.  Respiratory: Clear to auscultation bilaterally. Not using accessory muscles, speaking in full sentences.  Impression and Recommendations:

## 2016-03-25 ENCOUNTER — Ambulatory Visit (INDEPENDENT_AMBULATORY_CARE_PROVIDER_SITE_OTHER): Payer: BC Managed Care – PPO | Admitting: Sports Medicine

## 2016-03-25 DIAGNOSIS — R635 Abnormal weight gain: Secondary | ICD-10-CM

## 2016-03-25 DIAGNOSIS — E663 Overweight: Secondary | ICD-10-CM

## 2016-03-25 MED ORDER — PHENTERMINE HCL 37.5 MG PO TABS: ORAL_TABLET | ORAL | 0 refills | Status: DC

## 2016-03-25 MED ORDER — LORCASERIN HCL ER 20 MG PO TB24: 1.0000 | ORAL_TABLET | Freq: Every day | ORAL | 3 refills | Status: DC

## 2016-03-25 NOTE — Assessment & Plan Note (Signed)
Discontinue phentermine after a single additional month. Adding Belviq. Patient is overweight with complications of hypertension, hyperglycemia, and knee osteoarthritis   Return in 3 months.

## 2016-03-25 NOTE — Progress Notes (Signed)
  Subjective:    CC: Follow-up  HPI: Overweight: With complications of hypertension, hyperglycemia, knee osteoarthritis, has been on phentermine but has plateaued in terms of weight, agreeable to come off of the phentermine and try something else.  Past medical history, Surgical history, Family history not pertinant except as noted below, Social history, Allergies, and medications have been entered into the medical record, reviewed, and no changes needed.   Review of Systems: No fevers, chills, night sweats, weight loss, chest pain, or shortness of breath.   Objective:    General: Well Developed, well nourished, and in no acute distress.  Neuro: Alert and oriented x3, extra-ocular muscles intact, sensation grossly intact.  HEENT: Normocephalic, atraumatic, pupils equal round reactive to light, neck supple, no masses, no lymphadenopathy, thyroid nonpalpable.  Skin: Warm and dry, no rashes. Cardiac: Regular rate and rhythm, no murmurs rubs or gallops, no lower extremity edema.  Respiratory: Clear to auscultation bilaterally. Not using accessory muscles, speaking in full sentences.  Impression and Recommendations:    Overweight Discontinue phentermine after a single additional month. Adding Belviq. Patient is overweight with complications of hypertension, hyperglycemia, and knee osteoarthritis   Return in 3 months.  I spent 25 minutes with this patient, greater than 50% was face-to-face time counseling regarding the above diagnoses

## 2016-03-27 ENCOUNTER — Telehealth: Payer: Self-pay | Admitting: *Deleted

## 2016-03-27 NOTE — Telephone Encounter (Signed)
Submitted PA for Belviq over the phone. They will fax [place] a determination.

## 2016-03-28 NOTE — Telephone Encounter (Signed)
belviq has been denied due to BMI being just under 27. Please advise

## 2016-03-28 NOTE — Telephone Encounter (Signed)
noted 

## 2016-04-28 ENCOUNTER — Telehealth: Payer: Self-pay | Admitting: *Deleted

## 2016-04-28 NOTE — Telephone Encounter (Signed)
Patient is requesting another prescription for phentermine since the Belviq was denied. She states she has one more day left and wants to know what else can be done about getting another type of weight loss med or refill the phentermine, which is what she is currently taking. Pt states she has lost 4 lbs. Note insurance will prob not cover anything since last denial was due to BMI being under 27.

## 2016-04-29 ENCOUNTER — Encounter: Payer: Self-pay | Admitting: Sports Medicine

## 2016-05-02 ENCOUNTER — Encounter: Payer: Self-pay | Admitting: Sports Medicine

## 2016-05-02 MED ORDER — PHENDIMETRAZINE TARTRATE ER 105 MG PO CP24: ORAL_CAPSULE | ORAL | 0 refills | Status: DC

## 2016-05-02 NOTE — Telephone Encounter (Signed)
Other options include Saxenda and Contrave. She should do some research on both of those and let me know which one she would like to try.

## 2016-05-02 NOTE — Telephone Encounter (Signed)
Her insurance is not going to cover weight loss meds any longer because her BMI is too low and she is continuing to lose weight. So do you want to continue phentermine  and she pay out of pocket since it doesn't cost that much or just stop weight loss meds at this point and continue diet and exercise?

## 2016-05-02 NOTE — Telephone Encounter (Signed)
I just wrote her a prescription for phendimetrazine. Should be either in my box or faxed already.

## 2016-05-05 NOTE — Telephone Encounter (Signed)
noted 

## 2016-06-12 ENCOUNTER — Encounter: Payer: Self-pay | Admitting: Sports Medicine

## 2016-06-12 ENCOUNTER — Ambulatory Visit (INDEPENDENT_AMBULATORY_CARE_PROVIDER_SITE_OTHER): Payer: BC Managed Care – PPO | Admitting: Sports Medicine

## 2016-06-12 DIAGNOSIS — E663 Overweight: Secondary | ICD-10-CM

## 2016-06-12 DIAGNOSIS — M25421 Effusion, right elbow: Secondary | ICD-10-CM | POA: Diagnosis not present

## 2016-06-12 DIAGNOSIS — M7989 Other specified soft tissue disorders: Secondary | ICD-10-CM

## 2016-06-12 DIAGNOSIS — L0291 Cutaneous abscess, unspecified: Secondary | ICD-10-CM

## 2016-06-12 MED ORDER — PHENDIMETRAZINE TARTRATE ER 105 MG PO CP24: ORAL_CAPSULE | ORAL | 0 refills | Status: AC

## 2016-06-12 MED ORDER — DOXYCYCLINE HYCLATE 100 MG PO TABS: 100.0000 mg | ORAL_TABLET | Freq: Two times a day (BID) | ORAL | 0 refills | Status: AC

## 2016-06-12 NOTE — Progress Notes (Signed)
  Subjective:    CC: Follow-up  HPI: Abnormal weight gain: Good weight loss on phendimetrazine. She is not having enough to consider any of the branded weight loss medications.  Swelling on the right arm: Present after running, and when using her right arm. Denies any numbness or tingling. It is visibly swollen, no aching. No trauma.  Did have a history of breast imaging including mammogram and ultrasound as well as axillary imaging but this was all negative.  Abscess: Localized on the right upper shoulder. Only present for a few days, agrees to do conservative measures.  Past medical history:  Negative.  See flowsheet/record as well for more information.  Surgical history: Negative.  See flowsheet/record as well for more information.  Family history: Negative.  See flowsheet/record as well for more information.  Social history: Negative.  See flowsheet/record as well for more information.  Allergies, and medications have been entered into the medical record, reviewed, and no changes needed.   Review of Systems: No fevers, chills, night sweats, weight loss, chest pain, or shortness of breath.   Objective:    General: Well Developed, well nourished, and in no acute distress.  Neuro: Alert and oriented x3, extra-ocular muscles intact, sensation grossly intact.  HEENT: Normocephalic, atraumatic, pupils equal round reactive to light, neck supple, no masses, no lymphadenopathy, thyroid nonpalpable.  Skin: Warm and dry, no rashes. Cardiac: Regular rate and rhythm, no murmurs rubs or gallops, no lower extremity edema.  Respiratory: Clear to auscultation bilaterally. Not using accessory muscles, speaking in full sentences. Right arm: Normal in appearance with the exception of a small abscess on the right upper shoulder. No swelling, good pulses. No tenderness to palpation. Full range of motion.  Impression and Recommendations:    Abscess Doxycycline, return for excision if no  better.  Swelling of joint of upper arm, right There is an on swelling of the entire right upper extremity after running and after using her right arm. She does have a history of breast imaging that was negative, including the axilla, no evidence of breast cancer or axillary lymphadenopathy. We are going to proceed with an upper extremity venous Doppler, followed by a MR angiography to evaluate for vascular claudication of the right upper extremity if ultrasound is negative.  Overweight Refilling phendimetrazine, she is not having enough to consider any of the other branded weight loss medications, and after 2 months will need to maintain her weight on her own.

## 2016-06-12 NOTE — Assessment & Plan Note (Signed)
Doxycycline, return for excision if no better.

## 2016-06-12 NOTE — Assessment & Plan Note (Signed)
There is an on swelling of the entire right upper extremity after running and after using her right arm. She does have a history of breast imaging that was negative, including the axilla, no evidence of breast cancer or axillary lymphadenopathy. We are going to proceed with an upper extremity venous Doppler, followed by a MR angiography to evaluate for vascular claudication of the right upper extremity if ultrasound is negative.

## 2016-06-12 NOTE — Assessment & Plan Note (Signed)
Refilling phendimetrazine, she is not having enough to consider any of the other branded weight loss medications, and after 2 months will need to maintain her weight on her own.

## 2016-06-13 ENCOUNTER — Ambulatory Visit: Payer: BC Managed Care – PPO | Admitting: Sports Medicine

## 2016-06-16 ENCOUNTER — Other Ambulatory Visit: Payer: BC Managed Care – PPO

## 2016-06-19 ENCOUNTER — Ambulatory Visit: Payer: BC Managed Care – PPO | Admitting: Sports Medicine

## 2016-06-19 ENCOUNTER — Encounter: Payer: Self-pay | Admitting: Sports Medicine

## 2016-07-03 ENCOUNTER — Ambulatory Visit: Payer: BC Managed Care – PPO

## 2016-07-03 DIAGNOSIS — M25421 Effusion, right elbow: Secondary | ICD-10-CM

## 2016-09-03 ENCOUNTER — Encounter: Payer: Self-pay | Admitting: Sports Medicine

## 2016-09-07 ENCOUNTER — Other Ambulatory Visit: Payer: Self-pay | Admitting: Sports Medicine

## 2016-09-07 DIAGNOSIS — N951 Menopausal and female climacteric states: Secondary | ICD-10-CM

## 2016-09-21 ENCOUNTER — Encounter: Payer: Self-pay | Admitting: Sports Medicine

## 2016-09-25 ENCOUNTER — Other Ambulatory Visit: Payer: Self-pay | Admitting: Sports Medicine

## 2016-10-16 ENCOUNTER — Other Ambulatory Visit: Payer: Self-pay | Admitting: Sports Medicine

## 2016-12-03 ENCOUNTER — Encounter: Payer: Self-pay | Admitting: Sports Medicine

## 2017-01-03 ENCOUNTER — Other Ambulatory Visit: Payer: Self-pay | Admitting: Sports Medicine

## 2017-01-03 DIAGNOSIS — I10 Essential (primary) hypertension: Secondary | ICD-10-CM

## 2017-11-04 IMAGING — CR DG TIBIA/FIBULA 2V*R*
4 series · 4 of 4 positions shown · non-contrast
Comparison: None.

CLINICAL DATA: Right lower leg pain since beginning to run 3 weeks
ago. Initial encounter.

EXAM:
RIGHT TIBIA AND FIBULA - 2 VIEW

[tibia ap (1 of 2)]
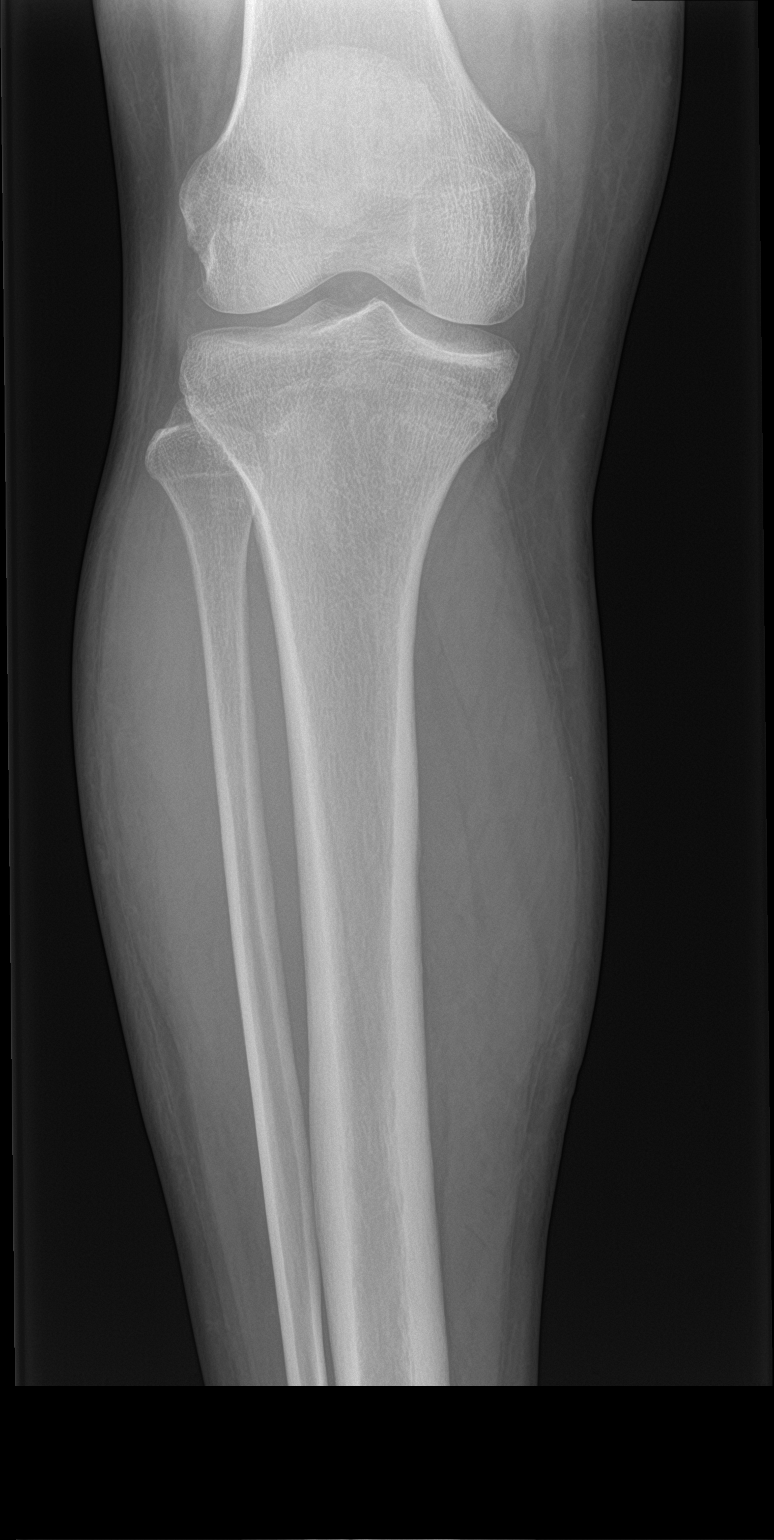

[tibia ap (2 of 2)]
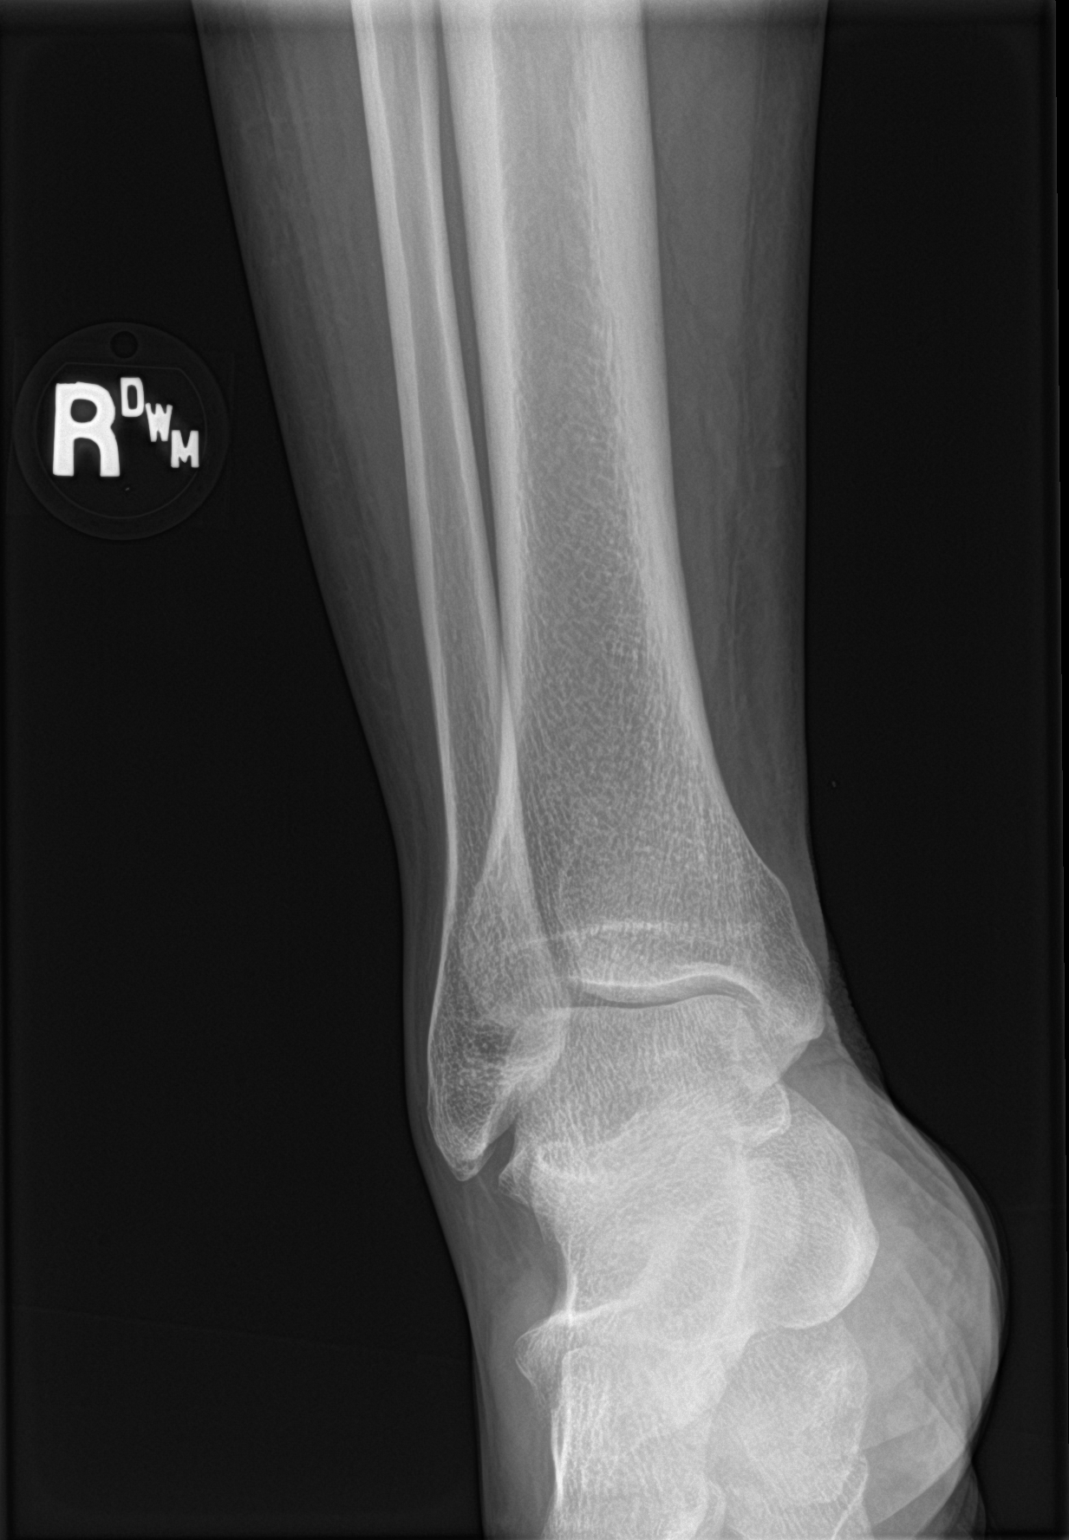

[tibia lat (1 of 2)]
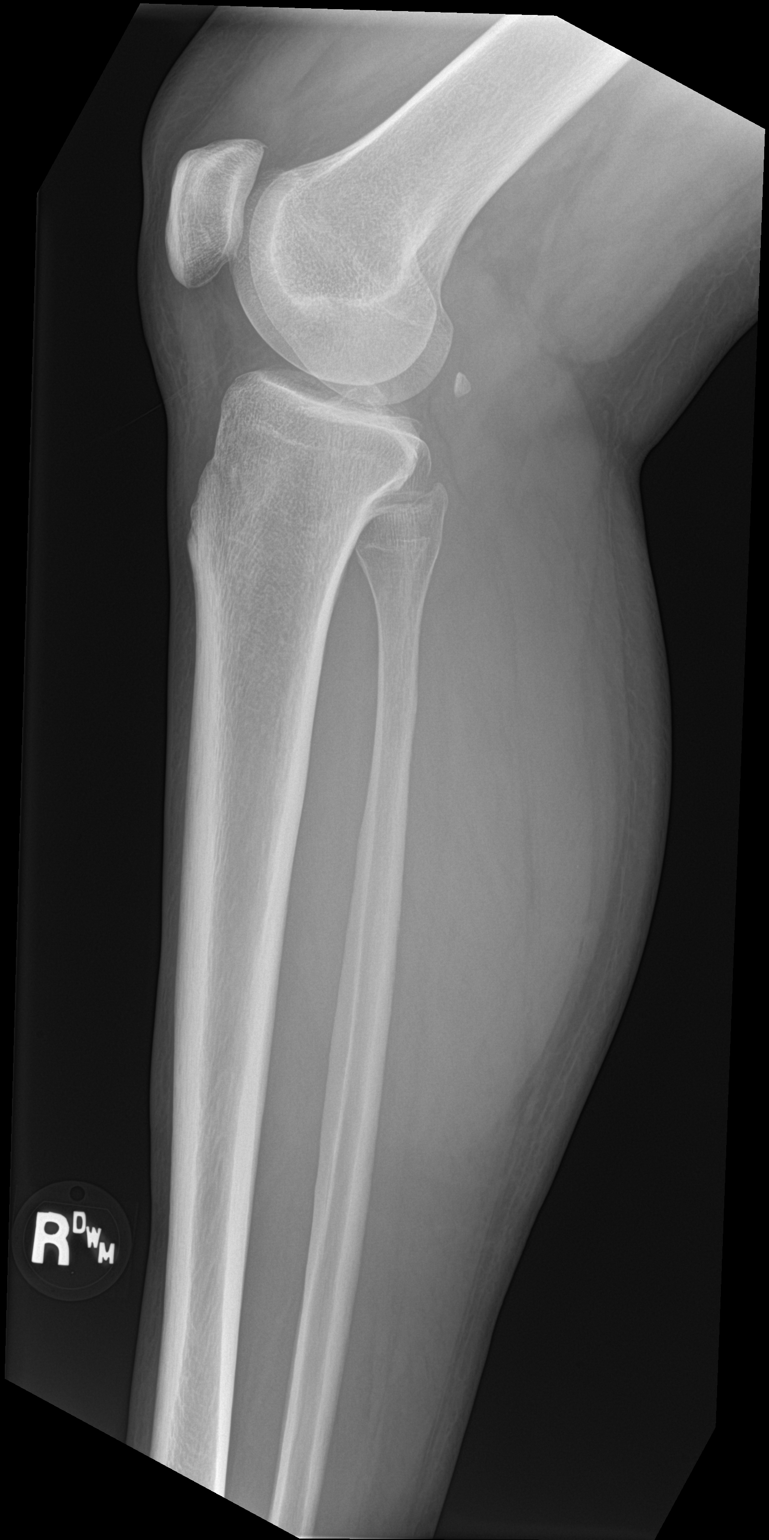

[tibia lat (2 of 2)]
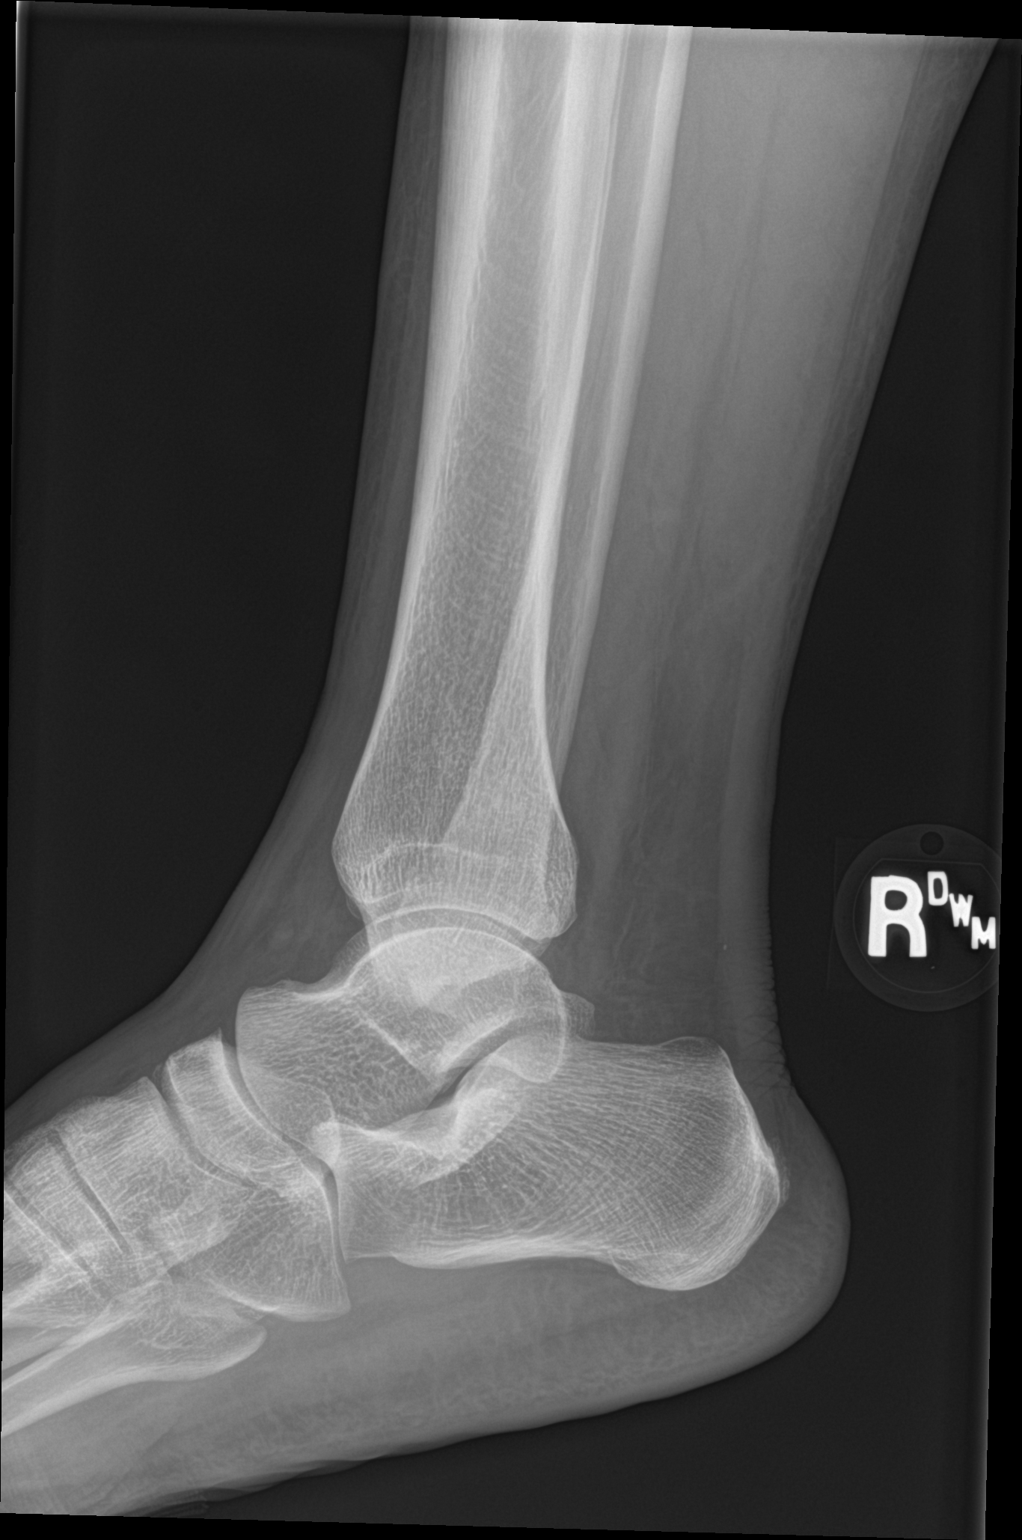

[4 of 4 positions shown; findings below may reference images not displayed]

FINDINGS: There is no evidence of fracture or other focal bone lesions. Soft
tissues are unremarkable.
IMPRESSION: Normal exam.

## 2017-11-04 IMAGING — CR DG TIBIA/FIBULA 2V*L*
4 series · 4 of 4 positions shown · non-contrast
Comparison: 12/12/2014

CLINICAL DATA: Left lower leg pain following running, initial
encounter

EXAM:
LEFT TIBIA AND FIBULA - 2 VIEW

[tibia ap (1 of 2)]
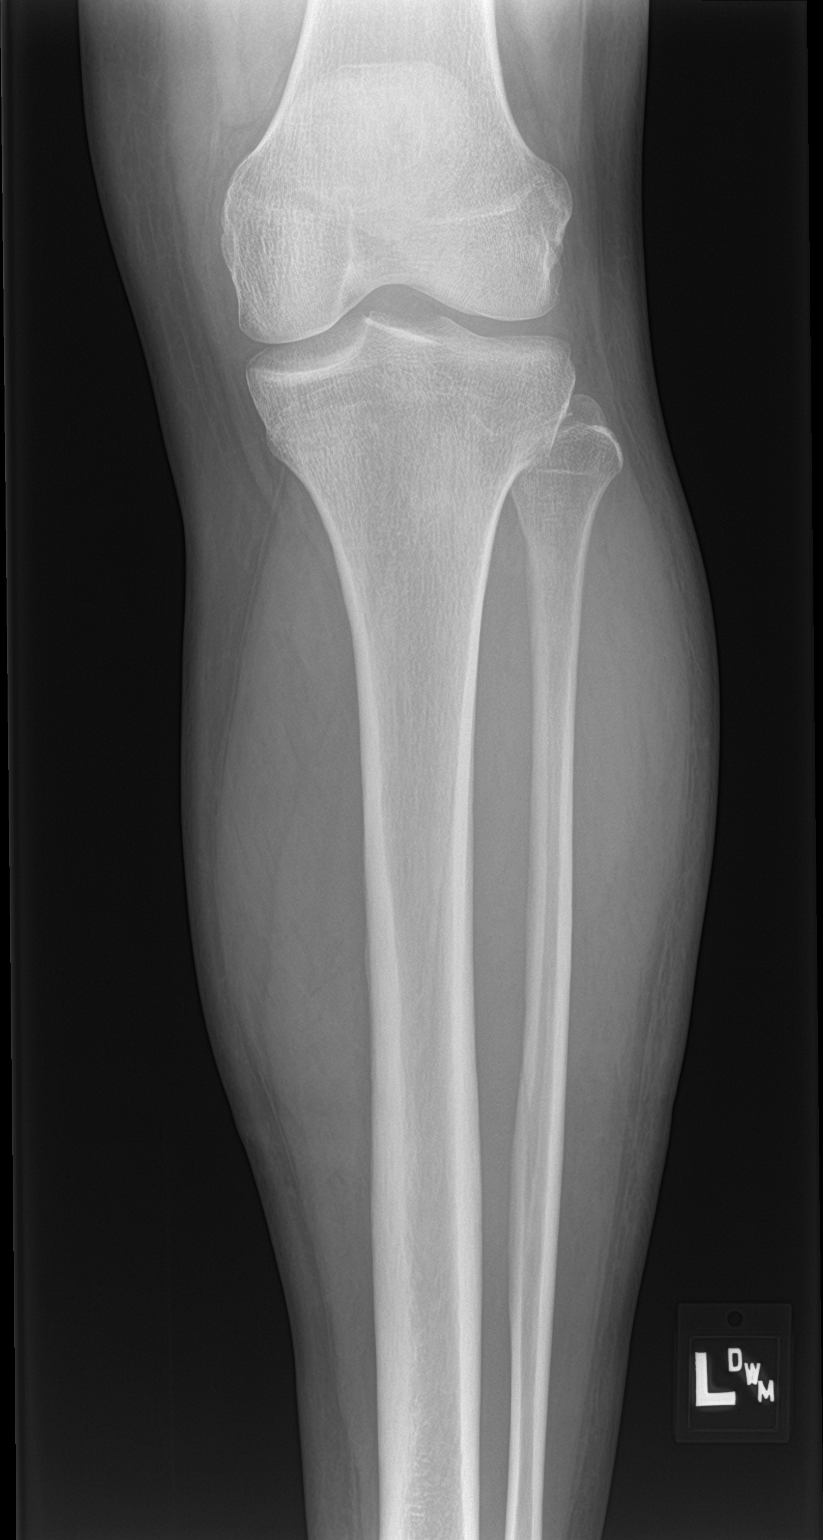

[tibia ap (2 of 2)]
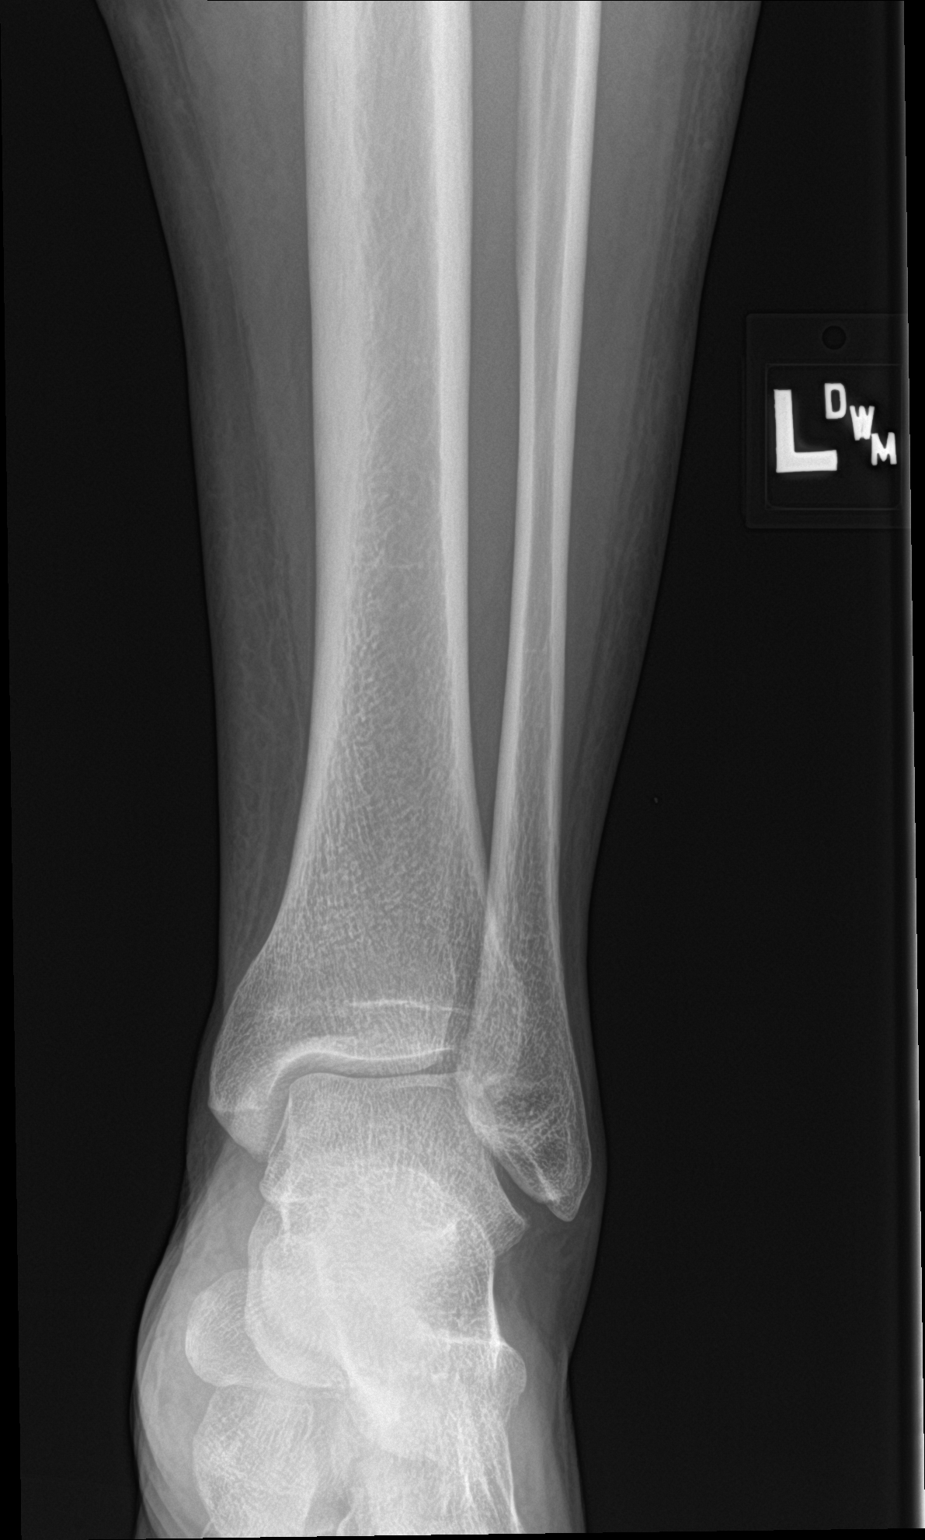

[tibia lat (1 of 2)]
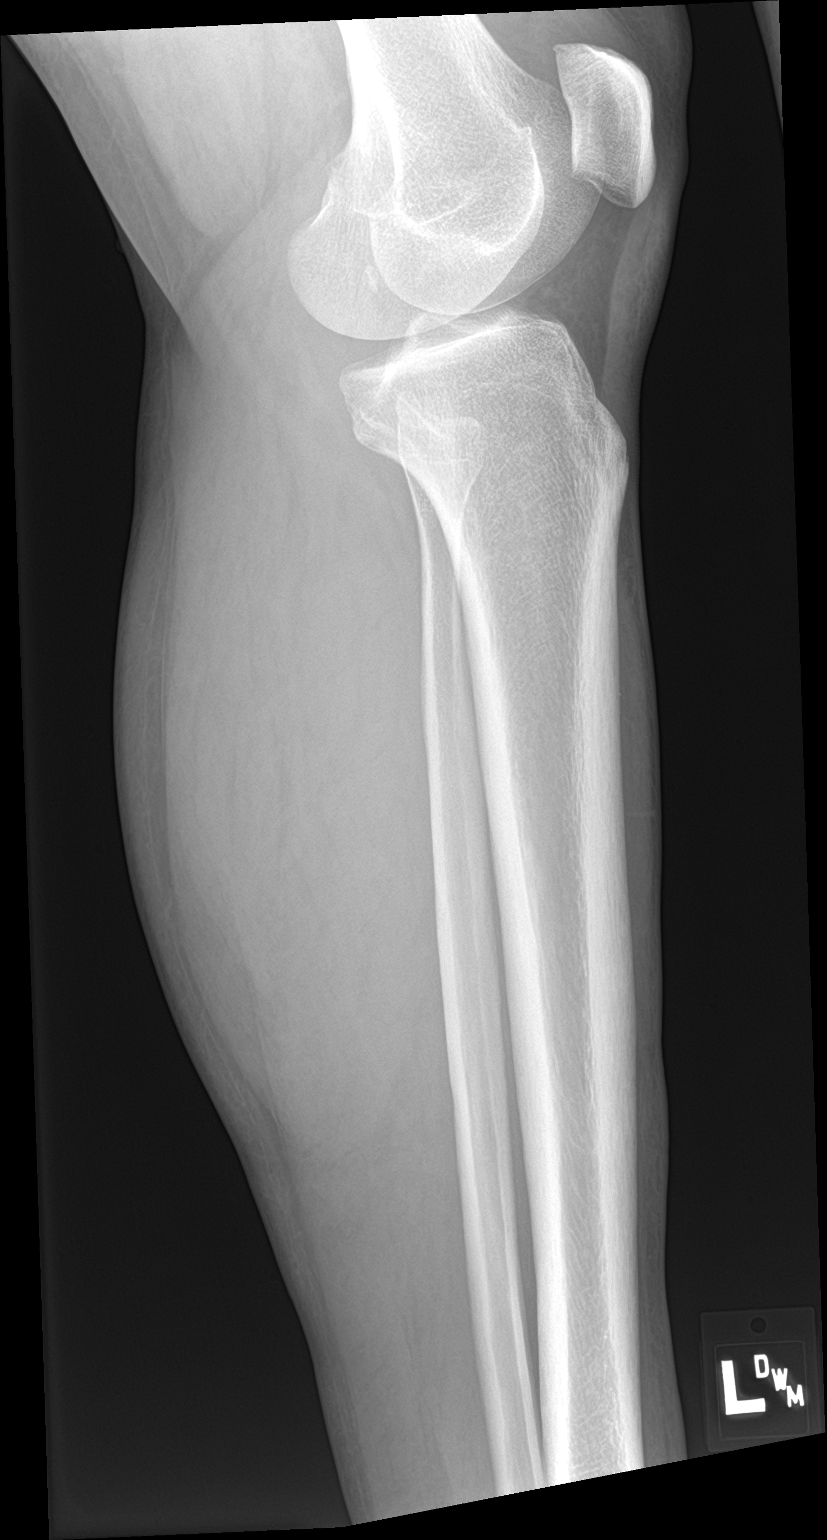

[tibia lat (2 of 2)]
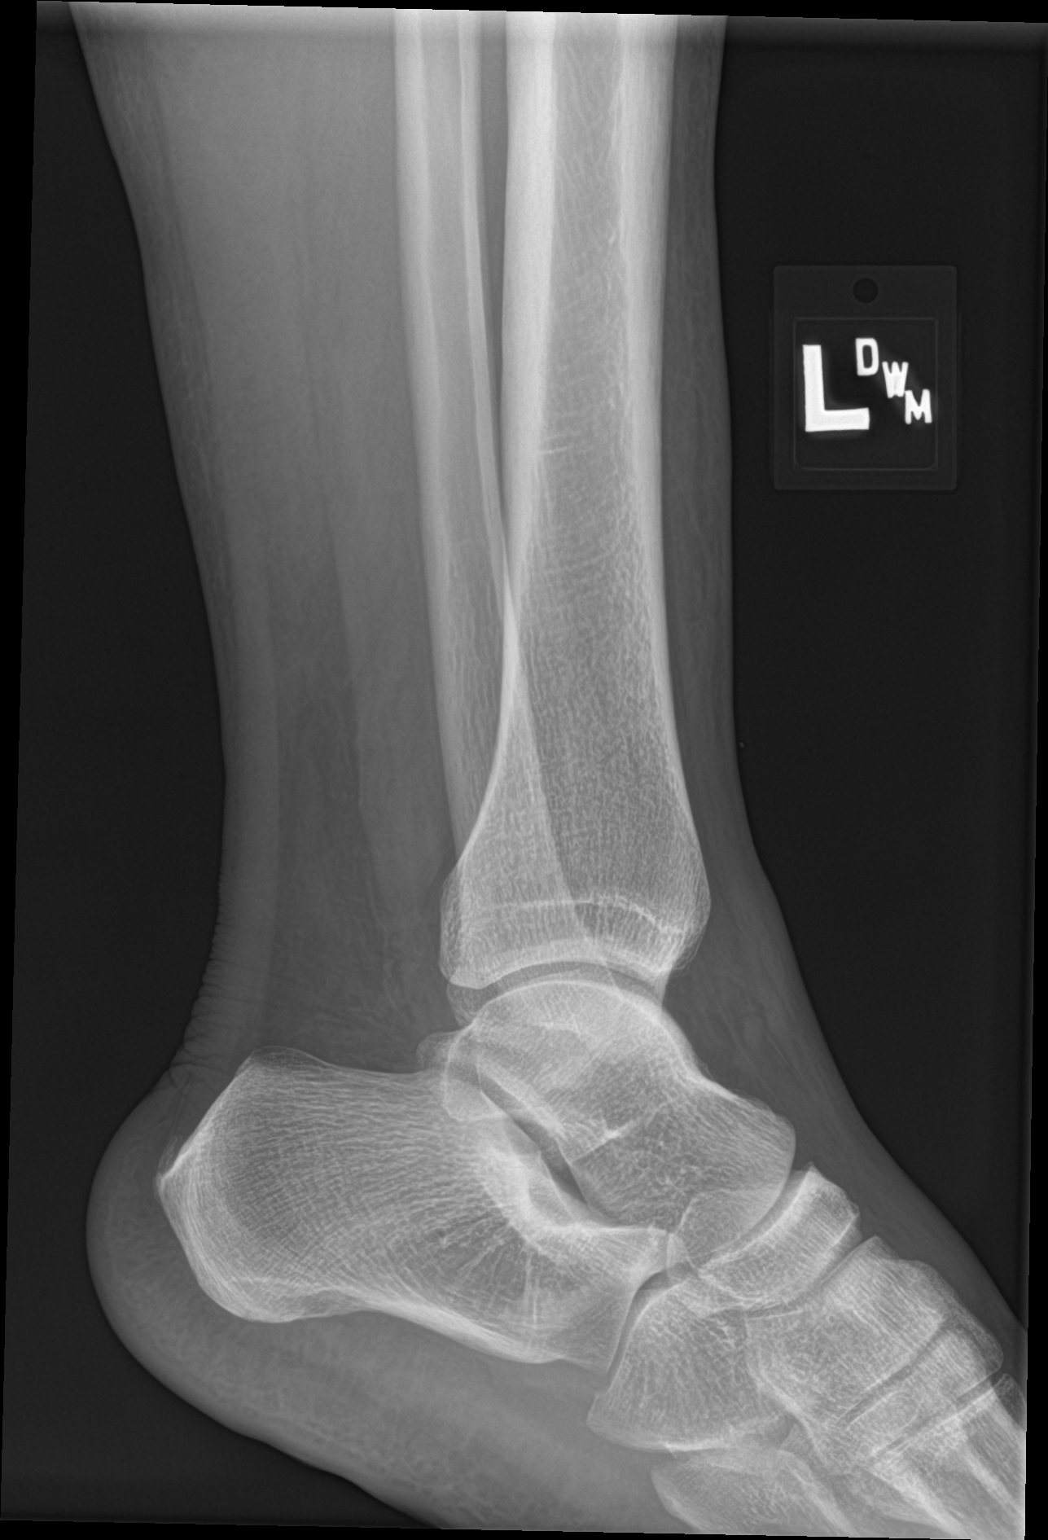

[4 of 4 positions shown; findings below may reference images not displayed]

FINDINGS: There is no evidence of fracture or other focal bone lesions. Soft
tissues are unremarkable.
IMPRESSION: No acute abnormality noted.

## 2018-01-11 ENCOUNTER — Other Ambulatory Visit: Payer: Self-pay | Admitting: Orthopedic Surgery

## 2018-01-18 ENCOUNTER — Encounter (HOSPITAL_COMMUNITY): Payer: Self-pay

## 2018-01-18 NOTE — Pre-Procedure Instructions (Addendum)
Amy Moyer  01/18/2018      [id number], [place] [address] [place] [address] Phone: [phone] Fax: [phone]    Your procedure is scheduled on January 21, 2018.  Report to [facility] Admitting at 800 AM.  Call this number if you have problems the morning of surgery:  [phone]   Remember:  Do not eat or drink after midnight.   Continue all medications as directed by your physician except follow these medication instructions before surgery below   Take these medicines the morning of surgery with A SIP OF WATER  Clear eyes-eye drops Sertraline (zoloft)  7 days prior to surgery STOP taking any Aspirin (unless otherwise instructed by your surgeon), Aleve, Naproxen, Ibuprofen, Motrin, Advil, Goody's, BC's, all herbal medications, fish oil, and all vitamins   Do not wear jewelry, make-up or nail polish.  Do not wear lotions, powders, or perfumes, or deodorant.  Do not shave 48 hours prior to surgery.    Do not bring valuables to the hospital.  [facility] is not responsible for any belongings or valuables.  Contacts, dentures or bridgework may not be worn into surgery.  Leave your suitcase in the car.  After surgery it may be brought to your room.  For patients admitted to the hospital, discharge time will be determined by your treatment team.  Patients discharged the day of surgery will not be allowed to drive home.    [place]- Preparing For Surgery  Before surgery, you can play an important role. Because skin is not sterile, your skin needs to be as free of germs as possible. You can reduce the number of germs on your skin by washing with CHG (chlorahexidine gluconate) Soap before surgery.  CHG is an antiseptic cleaner which kills germs and bonds with the skin to continue killing germs even after washing.    Oral Hygiene is also important to reduce your risk of infection.   Remember - BRUSH YOUR TEETH THE MORNING OF SURGERY WITH YOUR REGULAR TOOTHPASTE  Please do not use if you have an allergy to CHG or antibacterial soaps. If your skin becomes reddened/irritated stop using the CHG.  Do not shave (including legs and underarms) for at least 48 hours prior to first CHG shower. It is OK to shave your face.  Please follow these instructions carefully.   1. Shower the NIGHT BEFORE SURGERY and the MORNING OF SURGERY with CHG.   2. If you chose to wash your hair, wash your hair first as usual with your normal shampoo.  3. After you shampoo, rinse your hair and body thoroughly to remove the shampoo.  4. Use CHG as you would any other liquid soap. You can apply CHG directly to the skin and wash gently with a scrungie or a clean washcloth.   5. Apply the CHG Soap to your body ONLY FROM THE NECK DOWN.  Do not use on open wounds or open sores. Avoid contact with your eyes, ears, mouth and genitals (private parts). Wash Face and genitals (private parts)  with your normal soap.  6. Wash thoroughly, paying special attention to the area where your surgery will be performed.  7. Thoroughly rinse your body with warm water from the neck down.  8. DO NOT shower/wash with your normal soap after using and rinsing off the CHG Soap.  9. Pat yourself dry with a CLEAN TOWEL.  10. Wear CLEAN PAJAMAS to  bed the night before surgery, wear comfortable clothes the morning of surgery  11. Place CLEAN SHEETS on your bed the night of your first shower and DO NOT SLEEP WITH PETS.  Day of Surgery:  Do not apply any deodorants/lotions.  Please wear clean clothes to the hospital/surgery center.   Remember to brush your teeth WITH YOUR REGULAR TOOTHPASTE.  Please read over the following fact sheets that you were given. Pain Booklet, Coughing and Deep Breathing, MRSA Information and Surgical Site Infection Prevention

## 2018-01-19 ENCOUNTER — Encounter (HOSPITAL_COMMUNITY): Payer: Self-pay

## 2018-01-19 ENCOUNTER — Ambulatory Visit (HOSPITAL_COMMUNITY)
Admission: RE | Admit: 2018-01-19 | Discharge: 2018-01-19 | Disposition: A | Discharge status: Home / Self Care | Payer: BC Managed Care – PPO | Source: Ambulatory Visit | Attending: Orthopedic Surgery | Admitting: Orthopedic Surgery

## 2018-01-19 ENCOUNTER — Encounter (HOSPITAL_COMMUNITY)
Admission: RE | Admit: 2018-01-19 | Discharge: 2018-01-19 | Disposition: A | Discharge status: Home / Self Care | Payer: No Typology Code available for payment source | Source: Ambulatory Visit | Attending: Orthopedic Surgery | Admitting: Orthopedic Surgery

## 2018-01-19 DIAGNOSIS — I10 Essential (primary) hypertension: Secondary | ICD-10-CM | POA: Diagnosis not present

## 2018-01-19 DIAGNOSIS — M5412 Radiculopathy, cervical region: Secondary | ICD-10-CM | POA: Diagnosis not present

## 2018-01-19 DIAGNOSIS — Z01810 Encounter for preprocedural cardiovascular examination: Secondary | ICD-10-CM | POA: Insufficient documentation

## 2018-01-19 DIAGNOSIS — Z01818 Encounter for other preprocedural examination: Secondary | ICD-10-CM | POA: Insufficient documentation

## 2018-01-19 DIAGNOSIS — Z87440 Personal history of urinary (tract) infections: Secondary | ICD-10-CM | POA: Diagnosis not present

## 2018-01-19 DIAGNOSIS — M4802 Spinal stenosis, cervical region: Secondary | ICD-10-CM | POA: Diagnosis present

## 2018-01-19 DIAGNOSIS — G992 Myelopathy in diseases classified elsewhere: Secondary | ICD-10-CM | POA: Diagnosis not present

## 2018-01-19 DIAGNOSIS — Z79899 Other long term (current) drug therapy: Secondary | ICD-10-CM | POA: Diagnosis not present

## 2018-01-19 HISTORY — DX: Personal history of urinary (tract) infections: Z87.440

## 2018-01-19 LAB — CBC WITH DIFFERENTIAL/PLATELET
Abs Immature Granulocytes: 0 10*3/uL (ref 0.0–0.1)
Basophils Absolute: 0 10*3/uL (ref 0.0–0.1)
Basophils Relative: 0 %
EOS ABS: 0.1 10*3/uL (ref 0.0–0.7)
Eosinophils Relative: 1 %
HCT: 36.1 % (ref 36.0–46.0)
Hemoglobin: 11.9 g/dL — ABNORMAL LOW (ref 12.0–15.0)
IMMATURE GRANULOCYTES: 0 %
Lymphocytes Relative: 32 %
Lymphs Abs: 1.5 10*3/uL (ref 0.7–4.0)
MCH: 30 pg (ref 26.0–34.0)
MCHC: 33 g/dL (ref 30.0–36.0)
MCV: 90.9 fL (ref 78.0–100.0)
Monocytes Absolute: 0.4 10*3/uL (ref 0.1–1.0)
Monocytes Relative: 8 %
NEUTROS PCT: 59 %
Neutro Abs: 2.8 10*3/uL (ref 1.7–7.7)
Platelets: 216 10*3/uL (ref 150–400)
RBC: 3.97 MIL/uL (ref 3.87–5.11)
RDW: 12.7 % (ref 11.5–15.5)
WBC: 4.8 10*3/uL (ref 4.0–10.5)

## 2018-01-19 LAB — PROTIME-INR
INR: 1.01
PROTHROMBIN TIME: 13.2 s (ref 11.4–15.2)

## 2018-01-19 LAB — TYPE AND SCREEN
ABO/RH(D): A POS
ANTIBODY SCREEN: NEGATIVE

## 2018-01-19 LAB — URINALYSIS, ROUTINE W REFLEX MICROSCOPIC
BILIRUBIN URINE: NEGATIVE
Glucose, UA: NEGATIVE mg/dL
KETONES UR: 5 mg/dL — AB
NITRITE: NEGATIVE
Protein, ur: 30 mg/dL — AB
Specific Gravity, Urine: 1.026 (ref 1.005–1.030)
WBC, UA: >50 WBC/hpf — ABNORMAL HIGH (ref 0–5)
pH: 5 (ref 5.0–8.0)

## 2018-01-19 LAB — COMPREHENSIVE METABOLIC PANEL
ALBUMIN: 4 g/dL (ref 3.5–5.0)
ALT: 20 U/L (ref 14–54)
AST: 24 U/L (ref 15–41)
Alkaline Phosphatase: 66 U/L (ref 38–126)
Anion gap: 8 (ref 5–15)
BUN: 16 mg/dL (ref 6–20)
CHLORIDE: 108 mmol/L (ref 101–111)
CO2: 28 mmol/L (ref 22–32)
Calcium: 9.9 mg/dL (ref 8.9–10.3)
Creatinine, Ser: 0.75 mg/dL (ref 0.44–1.00)
GFR calc Af Amer: >60 mL/min (ref >60)
Glucose, Bld: 89 mg/dL (ref 65–99)
POTASSIUM: 4.2 mmol/L (ref 3.5–5.1)
Sodium: 144 mmol/L (ref 135–145)
Total Bilirubin: 0.9 mg/dL (ref 0.3–1.2)
Total Protein: 8.1 g/dL (ref 6.5–8.1)

## 2018-01-19 LAB — SURGICAL PCR SCREEN
MRSA, PCR: NEGATIVE
STAPHYLOCOCCUS AUREUS: NEGATIVE

## 2018-01-19 LAB — ABO/RH: ABO/RH(D): A POS

## 2018-01-19 LAB — APTT: aPTT: 26 seconds (ref 24–36)

## 2018-01-19 NOTE — Final Progress Note (Signed)
Pt felt like she had current uti and was taking left over antibiotics at home.

## 2018-01-19 NOTE — Progress Notes (Signed)
U/a results called to Butch Penny at [facility] office.

## 2018-01-20 NOTE — Progress Notes (Signed)
Anesthesia Chart Review:  Case:  [id number] Date/Time:  01/21/18 0945   Procedure:  ANTERIOR CERVICAL DECOMPRESSION FUSION CERVICAL 3-4, CERVICAL 4-5, CERVICAL5-6 WITH INSTRUMENTATION AND ALLOGRAFT (N/A )   Anesthesia type:  General   Pre-op diagnosis:  RIGHT ARM AND LEG PAIN   Location:  [facility] OR ROOM 20 / [place] [facility]   Surgeon:  Phylliss Bob, MD      DISCUSSION: Patient is a 57 year old female scheduled for the above procedure.  History includes never smoker, hypertension, hiatal hernia, recurrent UTIs, recurrent Bartholin's abscess.  Based on currently available information, I would anticipate that she can proceed as planned from an anesthesia standpoint.  VS: BP (!) 144/74   Pulse 64   Temp 36.8 C   Resp 20   Ht 5\' 6"  (1.676 m)   Wt 176 lb 4.8 oz (80 kg)   SpO2 99%   BMI 28.46 kg/m   PROVIDERS: Seltzer, Desiree Hane, MD ([facility] [place]; see [place]). Last visit 01/12/18. He was aware of surgery plans. He instructed her to stop phentermine.   LABS: Labs reviewed: Acceptable for surgery. However, UA abnormal showing cloudy appearance, small leukocytes, negative nitrates with urine microscopic showing few bacteria and greater than 50 WBCs.  UA results were called to the surgeon's office by the PAT RN. Defer treatment recommendations to surgeon. Patient had already been taking antibiotics that she had at home. (all labs ordered are listed, but only abnormal results are displayed)  Labs Reviewed  CBC WITH DIFFERENTIAL/PLATELET - Abnormal; Notable for the following components:      Result Value   Hemoglobin 11.9 (*)    All other components within normal limits  URINALYSIS, ROUTINE W REFLEX MICROSCOPIC - Abnormal; Notable for the following components:   APPearance CLOUDY (*)    Hgb urine dipstick SMALL (*)    Ketones, ur 5 (*)    Protein, ur 30 (*)    Leukocytes, UA SMALL (*)    WBC, UA >50 (*)    Bacteria, UA FEW (*)    Non Squamous Epithelial 0-5 (*)    All  other components within normal limits  SURGICAL PCR SCREEN  APTT  COMPREHENSIVE METABOLIC PANEL  PROTIME-INR  TYPE AND SCREEN  ABO/RH    IMAGES: CXR 01/19/18: IMPRESSION: There is no active cardiopulmonary disease.   EKG: 01/19/18: NSR   CV: Echo 03/13/15: Study Conclusions - Left ventricle: The cavity size was normal. Wall thickness was   normal. Systolic function was normal. The estimated ejection   fraction was in the range of 60% to 65%. Wall motion was normal;   there were no regional wall motion abnormalities. - Left atrium: The atrium was mildly dilated.  Past Medical History:  Diagnosis Date  . Hemorrhoids   . History of hiatal hernia   . History of recurrent UTIs   . Hypertension   . Infected cyst of Bartholin's gland duct   . Sepsis affecting skin   . Uterine fibroid     Past Surgical History:  Procedure Laterality Date  . ABDOMINAL HYSTERECTOMY  2005  . APPENDECTOMY    . BARTHOLIN CYST MARSUPIALIZATION N/A 08/31/2015   Procedure: BARTHOLIN CYST MARSUPIALIZATION Left;  Surgeon: Emily Filbert, MD;  Location: [facility];  Service: Gynecology;  Laterality: N/A;  . CESAREAN SECTION    . DIAGNOSTIC LAPAROSCOPY      MEDICATIONS: . ferrous sulfate 325 (65 FE) MG EC tablet  . fluticasone (FLONASE) 50 MCG/ACT nasal spray  .  furosemide (LASIX) 40 MG tablet  . ibuprofen (ADVIL,MOTRIN) 600 MG tablet  . losartan (COZAAR) 50 MG tablet  . magnesium oxide (MAG-OX) 400 MG tablet  . Naphazoline HCl (CLEAR EYES OP)  . Phendimetrazine Tartrate 105 MG CP24  . potassium chloride SA (K-DUR,KLOR-CON) 20 MEQ tablet  . sertraline (ZOLOFT) 50 MG tablet  . valsartan (DIOVAN) 160 MG tablet  . vitamin B-12 (CYANOCOBALAMIN) 1000 MCG tablet   No current facility-administered medications for this encounter.   She is not currently taking phendimetrazine tartrate, KCl, valsartan, vitamin B12, ferrous sulfate, Flonase, Lasix, ibuprofen, Mag-ox.   George Hugh [facility] Short Stay  Center/Anesthesiology Phone [phone] 01/20/2018 10:21 AM

## 2018-01-21 ENCOUNTER — Ambulatory Visit (HOSPITAL_COMMUNITY): Payer: No Typology Code available for payment source | Admitting: Emergency Medicine

## 2018-01-21 ENCOUNTER — Encounter (HOSPITAL_COMMUNITY)
Admission: RE | Disposition: A | Discharge status: Home / Self Care | Payer: Self-pay | Source: Ambulatory Visit | Attending: Orthopedic Surgery

## 2018-01-21 ENCOUNTER — Other Ambulatory Visit: Payer: Self-pay

## 2018-01-21 ENCOUNTER — Ambulatory Visit (HOSPITAL_COMMUNITY): Payer: No Typology Code available for payment source

## 2018-01-21 ENCOUNTER — Observation Stay (HOSPITAL_COMMUNITY)
Admission: RE | Admit: 2018-01-21 | Discharge: 2018-01-22 | Disposition: A | Discharge status: Home / Self Care | Payer: No Typology Code available for payment source | Source: Ambulatory Visit | Attending: Orthopedic Surgery | Admitting: Orthopedic Surgery

## 2018-01-21 ENCOUNTER — Encounter (HOSPITAL_COMMUNITY): Payer: Self-pay | Admitting: Surgery

## 2018-01-21 ENCOUNTER — Ambulatory Visit (HOSPITAL_COMMUNITY): Payer: No Typology Code available for payment source | Admitting: Certified Registered"

## 2018-01-21 DIAGNOSIS — Z79899 Other long term (current) drug therapy: Secondary | ICD-10-CM | POA: Insufficient documentation

## 2018-01-21 DIAGNOSIS — Z87440 Personal history of urinary (tract) infections: Secondary | ICD-10-CM | POA: Insufficient documentation

## 2018-01-21 DIAGNOSIS — M4802 Spinal stenosis, cervical region: Principal | ICD-10-CM | POA: Insufficient documentation

## 2018-01-21 DIAGNOSIS — M5412 Radiculopathy, cervical region: Secondary | ICD-10-CM | POA: Insufficient documentation

## 2018-01-21 DIAGNOSIS — I10 Essential (primary) hypertension: Secondary | ICD-10-CM | POA: Diagnosis not present

## 2018-01-21 DIAGNOSIS — Z419 Encounter for procedure for purposes other than remedying health state, unspecified: Secondary | ICD-10-CM

## 2018-01-21 DIAGNOSIS — G992 Myelopathy in diseases classified elsewhere: Secondary | ICD-10-CM | POA: Diagnosis not present

## 2018-01-21 DIAGNOSIS — M5410 Radiculopathy, site unspecified: Secondary | ICD-10-CM | POA: Diagnosis present

## 2018-01-21 HISTORY — PX: ANTERIOR CERVICAL DECOMP/DISCECTOMY FUSION: SHX1161

## 2018-01-21 SURGERY — ANTERIOR CERVICAL DECOMPRESSION/DISCECTOMY FUSION 2 LEVELS
Anesthesia: General

## 2018-01-21 MED ORDER — BUPIVACAINE-EPINEPHRINE (PF) 0.25% -1:200000 IJ SOLN
INTRAMUSCULAR | Status: AC
Start: 2018-01-21 — End: ?
  Filled 2018-01-21: qty 30

## 2018-01-21 MED ORDER — ROCURONIUM BROMIDE 50 MG/5ML IV SOLN
INTRAVENOUS | Status: AC
  Filled 2018-01-21: qty 1

## 2018-01-21 MED ORDER — THROMBIN 20000 UNITS EX SOLR
CUTANEOUS | Status: DC | PRN
  Administered 2018-01-21: 10:00:00 via TOPICAL

## 2018-01-21 MED ORDER — ONDANSETRON HCL 4 MG/2ML IJ SOLN: 4.0000 mg | Freq: Four times a day (QID) | INTRAMUSCULAR | Status: DC | PRN

## 2018-01-21 MED ORDER — CEFAZOLIN SODIUM-DEXTROSE 2-4 GM/100ML-% IV SOLN
2.0000 g | Freq: Three times a day (TID) | INTRAVENOUS | Status: AC
  Administered 2018-01-21 – 2018-01-22 (×2): 2 g via INTRAVENOUS
  Filled 2018-01-21 (×2): qty 100

## 2018-01-21 MED ORDER — MENTHOL 3 MG MT LOZG
1.0000 | LOZENGE | OROMUCOSAL | Status: DC | PRN
  Administered 2018-01-21 – 2018-01-22 (×2): 3 mg via ORAL
  Filled 2018-01-21 (×2): qty 9

## 2018-01-21 MED ORDER — CIPROFLOXACIN HCL 500 MG PO TABS
500.0000 mg | ORAL_TABLET | Freq: Two times a day (BID) | ORAL | Status: DC
  Administered 2018-01-21 – 2018-01-22 (×2): 500 mg via ORAL
  Filled 2018-01-21 (×2): qty 1

## 2018-01-21 MED ORDER — LABETALOL HCL 5 MG/ML IV SOLN
INTRAVENOUS | Status: DC | PRN
  Administered 2018-01-21: 10 mg via INTRAVENOUS

## 2018-01-21 MED ORDER — PROPOFOL 10 MG/ML IV BOLUS
INTRAVENOUS | Status: DC | PRN
  Administered 2018-01-21: 150 mg via INTRAVENOUS

## 2018-01-21 MED ORDER — LACTATED RINGERS IV SOLN
INTRAVENOUS | Status: DC
  Administered 2018-01-21 (×2): via INTRAVENOUS

## 2018-01-21 MED ORDER — FENTANYL CITRATE (PF) 250 MCG/5ML IJ SOLN
INTRAMUSCULAR | Status: AC
  Filled 2018-01-21: qty 5

## 2018-01-21 MED ORDER — POVIDONE-IODINE 7.5 % EX SOLN
Freq: Once | CUTANEOUS | Status: DC
  Filled 2018-01-21: qty 118

## 2018-01-21 MED ORDER — ONDANSETRON HCL 4 MG/2ML IJ SOLN
INTRAMUSCULAR | Status: AC
  Filled 2018-01-21: qty 2

## 2018-01-21 MED ORDER — SODIUM CHLORIDE 0.9 % IV SOLN
250.0000 mL | INTRAVENOUS | Status: DC
  Administered 2018-01-21: 250 mL via INTRAVENOUS

## 2018-01-21 MED ORDER — DOCUSATE SODIUM 100 MG PO CAPS
100.0000 mg | ORAL_CAPSULE | Freq: Two times a day (BID) | ORAL | Status: DC
  Administered 2018-01-21 – 2018-01-22 (×2): 100 mg via ORAL
  Filled 2018-01-21 (×2): qty 1

## 2018-01-21 MED ORDER — METOCLOPRAMIDE HCL 5 MG/ML IJ SOLN: 10.0000 mg | Freq: Once | INTRAMUSCULAR | Status: DC | PRN

## 2018-01-21 MED ORDER — DIAZEPAM 5 MG PO TABS
5.0000 mg | ORAL_TABLET | Freq: Four times a day (QID) | ORAL | Status: DC | PRN
  Administered 2018-01-21 – 2018-01-22 (×3): 5 mg via ORAL
  Filled 2018-01-21 (×3): qty 1

## 2018-01-21 MED ORDER — ONDANSETRON HCL 4 MG PO TABS: 4.0000 mg | ORAL_TABLET | Freq: Four times a day (QID) | ORAL | Status: DC | PRN

## 2018-01-21 MED ORDER — MIDAZOLAM HCL 2 MG/2ML IJ SOLN
INTRAMUSCULAR | Status: AC
  Filled 2018-01-21: qty 2

## 2018-01-21 MED ORDER — FLEET ENEMA 7-19 GM/118ML RE ENEM: 1.0000 | ENEMA | Freq: Once | RECTAL | Status: DC | PRN

## 2018-01-21 MED ORDER — MEPERIDINE HCL 50 MG/ML IJ SOLN: 6.2500 mg | INTRAMUSCULAR | Status: DC | PRN

## 2018-01-21 MED ORDER — ALBUTEROL SULFATE HFA 108 (90 BASE) MCG/ACT IN AERS
INHALATION_SPRAY | RESPIRATORY_TRACT | Status: DC | PRN
  Administered 2018-01-21 (×5): 2 via RESPIRATORY_TRACT

## 2018-01-21 MED ORDER — SODIUM CHLORIDE 0.9% FLUSH: 3.0000 mL | INTRAVENOUS | Status: DC | PRN

## 2018-01-21 MED ORDER — OXYCODONE-ACETAMINOPHEN 5-325 MG PO TABS
1.0000 | ORAL_TABLET | ORAL | Status: DC | PRN
  Administered 2018-01-21 – 2018-01-22 (×4): 2 via ORAL
  Filled 2018-01-21 (×4): qty 2

## 2018-01-21 MED ORDER — SUGAMMADEX SODIUM 200 MG/2ML IV SOLN
INTRAVENOUS | Status: AC
  Filled 2018-01-21: qty 2

## 2018-01-21 MED ORDER — SUGAMMADEX SODIUM 200 MG/2ML IV SOLN
INTRAVENOUS | Status: DC | PRN
  Administered 2018-01-21: 160 mg via INTRAVENOUS

## 2018-01-21 MED ORDER — PROPOFOL 10 MG/ML IV BOLUS
INTRAVENOUS | Status: AC
  Filled 2018-01-21: qty 20

## 2018-01-21 MED ORDER — SENNOSIDES-DOCUSATE SODIUM 8.6-50 MG PO TABS: 1.0000 | ORAL_TABLET | Freq: Every evening | ORAL | Status: DC | PRN

## 2018-01-21 MED ORDER — BISACODYL 5 MG PO TBEC: 5.0000 mg | DELAYED_RELEASE_TABLET | Freq: Every day | ORAL | Status: DC | PRN

## 2018-01-21 MED ORDER — ACETAMINOPHEN 325 MG PO TABS: 650.0000 mg | ORAL_TABLET | ORAL | Status: DC | PRN

## 2018-01-21 MED ORDER — LOSARTAN POTASSIUM 50 MG PO TABS
50.0000 mg | ORAL_TABLET | Freq: Every day | ORAL | Status: DC
  Administered 2018-01-21 – 2018-01-22 (×2): 50 mg via ORAL
  Filled 2018-01-21 (×2): qty 1

## 2018-01-21 MED ORDER — DEXAMETHASONE SODIUM PHOSPHATE 10 MG/ML IJ SOLN
INTRAMUSCULAR | Status: DC | PRN
  Administered 2018-01-21: 10 mg via INTRAVENOUS

## 2018-01-21 MED ORDER — ALBUTEROL SULFATE HFA 108 (90 BASE) MCG/ACT IN AERS
INHALATION_SPRAY | RESPIRATORY_TRACT | Status: AC
  Filled 2018-01-21: qty 6.7

## 2018-01-21 MED ORDER — CEFAZOLIN SODIUM-DEXTROSE 2-4 GM/100ML-% IV SOLN
2.0000 g | INTRAVENOUS | Status: AC
  Administered 2018-01-21: 2 g via INTRAVENOUS
  Filled 2018-01-21: qty 100

## 2018-01-21 MED ORDER — DEXAMETHASONE SODIUM PHOSPHATE 10 MG/ML IJ SOLN
INTRAMUSCULAR | Status: AC
  Filled 2018-01-21: qty 1

## 2018-01-21 MED ORDER — ROCURONIUM BROMIDE 100 MG/10ML IV SOLN
INTRAVENOUS | Status: DC | PRN
  Administered 2018-01-21 (×2): 50 mg via INTRAVENOUS

## 2018-01-21 MED ORDER — LIDOCAINE 2% (20 MG/ML) 5 ML SYRINGE
INTRAMUSCULAR | Status: AC
  Filled 2018-01-21: qty 5

## 2018-01-21 MED ORDER — ONDANSETRON HCL 4 MG/2ML IJ SOLN
INTRAMUSCULAR | Status: DC | PRN
  Administered 2018-01-21: 4 mg via INTRAVENOUS

## 2018-01-21 MED ORDER — PHENOL 1.4 % MT LIQD: 1.0000 | OROMUCOSAL | Status: DC | PRN

## 2018-01-21 MED ORDER — ALUM & MAG HYDROXIDE-SIMETH 200-200-20 MG/5ML PO SUSP: 30.0000 mL | Freq: Four times a day (QID) | ORAL | Status: DC | PRN

## 2018-01-21 MED ORDER — ZOLPIDEM TARTRATE 5 MG PO TABS: 5.0000 mg | ORAL_TABLET | Freq: Every evening | ORAL | Status: DC | PRN

## 2018-01-21 MED ORDER — SODIUM CHLORIDE 0.9% FLUSH
3.0000 mL | Freq: Two times a day (BID) | INTRAVENOUS | Status: DC
  Administered 2018-01-21: 3 mL via INTRAVENOUS

## 2018-01-21 MED ORDER — LABETALOL HCL 5 MG/ML IV SOLN
INTRAVENOUS | Status: AC
  Filled 2018-01-21: qty 4

## 2018-01-21 MED ORDER — THROMBIN 20000 UNITS EX SOLR
CUTANEOUS | Status: AC
  Filled 2018-01-21: qty 20000

## 2018-01-21 MED ORDER — LACTATED RINGERS IV SOLN: INTRAVENOUS | Status: DC

## 2018-01-21 MED ORDER — SERTRALINE HCL 50 MG PO TABS
100.0000 mg | ORAL_TABLET | Freq: Every day | ORAL | Status: DC
  Administered 2018-01-21 – 2018-01-22 (×2): 100 mg via ORAL
  Filled 2018-01-21 (×2): qty 2

## 2018-01-21 MED ORDER — BUPIVACAINE-EPINEPHRINE 0.25% -1:200000 IJ SOLN
INTRAMUSCULAR | Status: DC | PRN
  Administered 2018-01-21: 5 mL

## 2018-01-21 MED ORDER — PANTOPRAZOLE SODIUM 40 MG PO TBEC
40.0000 mg | DELAYED_RELEASE_TABLET | Freq: Every day | ORAL | Status: DC
  Administered 2018-01-21 – 2018-01-22 (×2): 40 mg via ORAL
  Filled 2018-01-21 (×2): qty 1

## 2018-01-21 MED ORDER — 0.9 % SODIUM CHLORIDE (POUR BTL) OPTIME
TOPICAL | Status: DC | PRN
  Administered 2018-01-21 (×2): 1000 mL

## 2018-01-21 MED ORDER — FENTANYL CITRATE (PF) 100 MCG/2ML IJ SOLN: 25.0000 ug | INTRAMUSCULAR | Status: DC | PRN

## 2018-01-21 MED ORDER — ACETAMINOPHEN 650 MG RE SUPP: 650.0000 mg | RECTAL | Status: DC | PRN

## 2018-01-21 MED ORDER — FENTANYL CITRATE (PF) 100 MCG/2ML IJ SOLN
INTRAMUSCULAR | Status: DC | PRN
  Administered 2018-01-21 (×11): 50 ug via INTRAVENOUS

## 2018-01-21 MED ORDER — LIDOCAINE HCL (CARDIAC) PF 100 MG/5ML IV SOSY
PREFILLED_SYRINGE | INTRAVENOUS | Status: DC | PRN
  Administered 2018-01-21: 60 mg via INTRAVENOUS

## 2018-01-21 MED ORDER — MIDAZOLAM HCL 5 MG/5ML IJ SOLN
INTRAMUSCULAR | Status: DC | PRN
  Administered 2018-01-21: 2 mg via INTRAVENOUS

## 2018-01-21 SURGICAL SUPPLY — 77 items
BENZOIN TINCTURE PRP APPL 2/3 (GAUZE/BANDAGES/DRESSINGS) ×3 IMPLANT
BIT DRILL NEURO 2X3.1 SFT TUCH (MISCELLANEOUS) ×1 IMPLANT
BIT DRILL SRG 14X2.2XFLT CHK (BIT) ×1 IMPLANT
BIT DRL SRG 14X2.2XFLT CHK (BIT) ×1
BLADE CLIPPER SURG (BLADE) ×3 IMPLANT
BLADE SURG 15 STRL LF DISP TIS (BLADE) ×1 IMPLANT
BLADE SURG 15 STRL SS (BLADE) ×2
BONE VIVIGEN FORMABLE 1.3CC (Bone Implant) ×6 IMPLANT
BUR MATCHSTICK NEURO 3.0 LAGG (BURR) IMPLANT
CARTRIDGE OIL MAESTRO DRILL (MISCELLANEOUS) ×1 IMPLANT
CLOSURE WOUND 1/2 X4 (GAUZE/BANDAGES/DRESSINGS) ×1
COLLAR CERV LO CONTOUR FIRM DE (SOFTGOODS) ×3 IMPLANT
CORDS BIPOLAR (ELECTRODE) ×3 IMPLANT
COVER SURGICAL LIGHT HANDLE (MISCELLANEOUS) ×3 IMPLANT
CRADLE DONUT ADULT HEAD (MISCELLANEOUS) ×3 IMPLANT
DECANTER SPIKE VIAL GLASS SM (MISCELLANEOUS) ×3 IMPLANT
DIFFUSER DRILL AIR PNEUMATIC (MISCELLANEOUS) ×3 IMPLANT
DRAIN JACKSON RD 7FR 3/32 (WOUND CARE) IMPLANT
DRAPE C-ARM 42X72 X-RAY (DRAPES) ×3 IMPLANT
DRAPE POUCH INSTRU U-SHP 10X18 (DRAPES) ×3 IMPLANT
DRAPE SURG 17X23 STRL (DRAPES) ×12 IMPLANT
DRILL BIT SKYLINE 14MM (BIT) ×2
DRILL NEURO 2X3.1 SOFT TOUCH (MISCELLANEOUS) ×3
DURAPREP 26ML APPLICATOR (WOUND CARE) ×3 IMPLANT
ELECT COATED BLADE 2.86 ST (ELECTRODE) ×3 IMPLANT
ELECT REM PT RETURN 9FT ADLT (ELECTROSURGICAL) ×3
ELECTRODE REM PT RTRN 9FT ADLT (ELECTROSURGICAL) ×1 IMPLANT
EVACUATOR SILICONE 100CC (DRAIN) IMPLANT
GAUZE SPONGE 4X4 12PLY STRL (GAUZE/BANDAGES/DRESSINGS) ×3 IMPLANT
GAUZE SPONGE 4X4 16PLY XRAY LF (GAUZE/BANDAGES/DRESSINGS) ×3 IMPLANT
GLOVE BIO SURGEON STRL SZ7 (GLOVE) ×3 IMPLANT
GLOVE BIO SURGEON STRL SZ8 (GLOVE) ×3 IMPLANT
GLOVE BIOGEL PI IND STRL 7.0 (GLOVE) ×3 IMPLANT
GLOVE BIOGEL PI IND STRL 7.5 (GLOVE) ×1 IMPLANT
GLOVE BIOGEL PI IND STRL 8 (GLOVE) ×1 IMPLANT
GLOVE BIOGEL PI INDICATOR 7.0 (GLOVE) ×6
GLOVE BIOGEL PI INDICATOR 7.5 (GLOVE) ×2
GLOVE BIOGEL PI INDICATOR 8 (GLOVE) ×2
GOWN STRL REUS W/ TWL LRG LVL3 (GOWN DISPOSABLE) ×2 IMPLANT
GOWN STRL REUS W/ TWL XL LVL3 (GOWN DISPOSABLE) ×2 IMPLANT
GOWN STRL REUS W/TWL LRG LVL3 (GOWN DISPOSABLE) ×4
GOWN STRL REUS W/TWL XL LVL3 (GOWN DISPOSABLE) ×4
INTERLOCK LRDTC CRVCL VBR 7MM (Bone Implant) ×3 IMPLANT
IV CATH 14GX2 1/4 (CATHETERS) ×3 IMPLANT
KIT BASIN OR (CUSTOM PROCEDURE TRAY) ×3 IMPLANT
KIT TURNOVER KIT B (KITS) ×3 IMPLANT
LORDOTIC CERVICAL VBR 7MM SM (Bone Implant) ×9 IMPLANT
MANIFOLD NEPTUNE II (INSTRUMENTS) ×3 IMPLANT
NEEDLE PRECISIONGLIDE 27X1.5 (NEEDLE) ×3 IMPLANT
NEEDLE SPNL 20GX3.5 QUINCKE YW (NEEDLE) ×3 IMPLANT
NS IRRIG 1000ML POUR BTL (IV SOLUTION) ×3 IMPLANT
OIL CARTRIDGE MAESTRO DRILL (MISCELLANEOUS) ×3
PACK ORTHO CERVICAL (CUSTOM PROCEDURE TRAY) ×3 IMPLANT
PAD ARMBOARD 7.5X6 YLW CONV (MISCELLANEOUS) ×6 IMPLANT
PATTIES SURGICAL .5 X.5 (GAUZE/BANDAGES/DRESSINGS) IMPLANT
PATTIES SURGICAL .5 X1 (DISPOSABLE) ×3 IMPLANT
PIN DISTRACTION 14 (PIN) ×6 IMPLANT
PLATE SKYLINE 3 LVL 48MM (Plate) ×3 IMPLANT
SCREW SKYLINE VAR OS 14MM (Screw) ×24 IMPLANT
SPONGE INTESTINAL PEANUT (DISPOSABLE) ×6 IMPLANT
SPONGE SURGIFOAM ABS GEL 100 (HEMOSTASIS) ×3 IMPLANT
STRIP CLOSURE SKIN 1/2X4 (GAUZE/BANDAGES/DRESSINGS) ×2 IMPLANT
SURGIFLO W/THROMBIN 8M KIT (HEMOSTASIS) IMPLANT
SUT MNCRL AB 4-0 PS2 18 (SUTURE) ×3 IMPLANT
SUT SILK 4 0 (SUTURE)
SUT SILK 4-0 18XBRD TIE 12 (SUTURE) IMPLANT
SUT VIC AB 2-0 CT2 18 VCP726D (SUTURE) ×3 IMPLANT
SYR BULB IRRIGATION 50ML (SYRINGE) ×3 IMPLANT
SYR CONTROL 10ML LL (SYRINGE) ×9 IMPLANT
TAPE CLOTH 4X10 WHT NS (GAUZE/BANDAGES/DRESSINGS) IMPLANT
TAPE CLOTH SURG 4X10 WHT LF (GAUZE/BANDAGES/DRESSINGS) ×3 IMPLANT
TAPE UMBILICAL COTTON 1/8X30 (MISCELLANEOUS) ×3 IMPLANT
TOWEL OR 17X24 6PK STRL BLUE (TOWEL DISPOSABLE) ×3 IMPLANT
TOWEL OR 17X26 10 PK STRL BLUE (TOWEL DISPOSABLE) ×3 IMPLANT
TRAY FOLEY CATH 16FRSI W/METER (SET/KITS/TRAYS/PACK) ×3 IMPLANT
WATER STERILE IRR 1000ML POUR (IV SOLUTION) ×3 IMPLANT
YANKAUER SUCT BULB TIP NO VENT (SUCTIONS) ×3 IMPLANT

## 2018-01-21 NOTE — Anesthesia Preprocedure Evaluation (Signed)
Anesthesia Evaluation  Patient identified by MRN, date of birth, ID band Patient awake    Reviewed: Allergy & Precautions, NPO status , Patient's Chart, lab work & pertinent test results  Airway Mallampati: II  TM Distance: >3 FB Neck ROM: Full    Dental no notable dental hx.    Pulmonary neg pulmonary ROS,    Pulmonary exam normal breath sounds clear to auscultation       Cardiovascular hypertension, Pt. on medications negative cardio ROS Normal cardiovascular exam Rhythm:Regular Rate:Normal     Neuro/Psych negative neurological ROS  negative psych ROS   GI/Hepatic negative GI ROS, Neg liver ROS, neg GERD  ,  Endo/Other  negative endocrine ROS  Renal/GU negative Renal ROS  negative genitourinary   Musculoskeletal negative musculoskeletal ROS (+)   Abdominal   Peds negative pediatric ROS (+)  Hematology negative hematology ROS (+)   Anesthesia Other Findings   Reproductive/Obstetrics negative OB ROS                             Anesthesia Physical Anesthesia Plan  ASA: II  Anesthesia Plan: General   Post-op Pain Management:    Induction: Intravenous  PONV Risk Score and Plan: 3 and Ondansetron, Dexamethasone, Midazolam and Treatment may vary due to age or medical condition  Airway Management Planned: Oral ETT  Additional Equipment:   Intra-op Plan:   Post-operative Plan: Extubation in OR  Informed Consent: I have reviewed the patients History and Physical, chart, labs and discussed the procedure including the risks, benefits and alternatives for the proposed anesthesia with the patient or authorized representative who has indicated his/her understanding and acceptance.   Dental advisory given  Plan Discussed with: CRNA  Anesthesia Plan Comments:         Anesthesia Quick Evaluation

## 2018-01-21 NOTE — Transfer of Care (Signed)
Immediate Anesthesia Transfer of Care Note  Patient: Chanette Demo  Procedure(s) Performed: ANTERIOR CERVICAL DECOMPRESSION FUSION CERVICAL THREE-FOUR, CERVICAL FOUR-FIVE , CERVICAL FIVE-SIX WITH INSTRUMENTATION AND ALLOGRAFT (N/A )  Patient Location: PACU  Anesthesia Type:General  Level of Consciousness: awake and patient cooperative  Airway & Oxygen Therapy: Patient Spontanous Breathing and Patient connected to face mask oxygen  Post-op Assessment: Report given to RN and Post -op Vital signs reviewed and stable  Post vital signs: Reviewed and stable  Last Vitals:  Vitals Value Taken Time  BP 146/72 01/21/2018  2:40 PM  Temp 36.5 C 01/21/2018  2:37 PM  Pulse 73 01/21/2018  2:41 PM  Resp 10 01/21/2018  2:41 PM  SpO2 99 % 01/21/2018  2:41 PM  Vitals shown include unvalidated device data.  Last Pain:  Vitals:   01/21/18 1437  PainSc: Asleep      Patients Stated Pain Goal: 3 (98/92/11 9417)  Complications: No apparent anesthesia complications

## 2018-01-21 NOTE — Anesthesia Postprocedure Evaluation (Signed)
Anesthesia Post Note  Patient: Amy Moyer  Procedure(s) Performed: ANTERIOR CERVICAL DECOMPRESSION FUSION CERVICAL THREE-FOUR, CERVICAL FOUR-FIVE , CERVICAL FIVE-SIX WITH INSTRUMENTATION AND ALLOGRAFT (N/A )     Patient location during evaluation: PACU Anesthesia Type: General Level of consciousness: sedated Pain management: pain level controlled Vital Signs Assessment: post-procedure vital signs reviewed and stable Respiratory status: spontaneous breathing and respiratory function stable Cardiovascular status: stable Postop Assessment: no apparent nausea or vomiting Anesthetic complications: no    Last Vitals:  Vitals:   01/21/18 1456 01/21/18 1510  BP: 136/74 (!) 155/84  Pulse: 67 62  Resp: 17 10  Temp:    SpO2: 99% 98%    Last Pain:  Vitals:   01/21/18 1525  PainSc: Asleep                 Darrly Loberg DANIEL

## 2018-01-21 NOTE — Anesthesia Procedure Notes (Signed)
Procedure Name: Intubation Date/Time: 01/21/2018 11:33 AM Performed by: Lance Coon, CRNA Pre-anesthesia Checklist: Patient identified, Emergency Drugs available, Suction available, Patient being monitored and Timeout performed Patient Re-evaluated:Patient Re-evaluated prior to induction Oxygen Delivery Method: Circle system utilized Preoxygenation: Pre-oxygenation with 100% oxygen Induction Type: IV induction Ventilation: Mask ventilation without difficulty Laryngoscope Size: Miller and 3 Grade View: Grade II Tube type: Oral Tube size: 7.0 mm Number of attempts: 1 Airway Equipment and Method: Stylet Placement Confirmation: ETT inserted through vocal cords under direct vision,  positive ETCO2 and breath sounds checked- equal and bilateral Secured at: 21 cm Tube secured with: Tape Dental Injury: Teeth and Oropharynx as per pre-operative assessment

## 2018-01-21 NOTE — Op Note (Signed)
NAME:  Amy Moyer                MEDICAL RECORD NO.:  [id number]  PHYSICIAN:  Phylliss Bob, MD      DATE OF BIRTH:  1961/05/08  DATE OF PROCEDURE:  01/21/2018                              OPERATIVE REPORT   PREOPERATIVE DIAGNOSES: 1. Cervical Myelopathy 2. Congenital spinal stenosis spanning C3-C6  POSTOPERATIVE DIAGNOSES: 1. Cervical Myelopathy 2. Congenital spinal stenosis spanning C3-C6  PROCEDURE: 1. Anterior cervical decompression and fusion C3/4, C4/5, C5/6 2. Placement of anterior instrumentation, C3-C6 3. Insertion of interbody device x3 (48mm lordotic Titan intervertebral spacers). 4. Intraoperative use of fluoroscopy. 5. Use of morselized allograft - ViviGen.  SURGEON:  Phylliss Bob, MD  ASSISTANT:  Pricilla Holm, PA-C.  ANESTHESIA:  General endotracheal anesthesia.  COMPLICATIONS:  None.  DISPOSITION:  Stable.  ESTIMATED BLOOD LOSS:  Minimal.  INDICATIONS FOR SURGERY:  Briefly, Amy Moyer is a pleasant 57 year old patient, who did present to me with my point across the clinical symptoms consistent with progressive cervical myelopathy.  Her symptoms did begin at the time of a work injury dated 04/03/2017.  Her MRI findings were as reflected above. Given the patient's progressive symptoms, we did discuss proceeding with the procedure noted above.  The patient was fully aware of the risks and limitations of surgery as outlined in my preoperative note.  OPERATIVE DETAILS:  On 01/21/2018, the patient was brought to surgery and general endotracheal anesthesia was administered.  The patient was placed supine on the hospital bed. The neck was gently extended.  All bony prominences were meticulously padded.  The neck was prepped and draped in the usual sterile fashion.  At this point, I did make a left-sided transverse incision.  The platysma was incised.  A Smith-Robinson approach was used and the anterior spine was identified. A self-retaining  retractor was placed.  I then subperiosteally exposed the vertebral bodies from C3-C6.  Caspar pins were then placed into the C5 and C6 vertebral bodies, and distraction was applied.  A thorough and complete C5/6 intervertebral diskectomy was performed.  The posterior longitudinal ligament was identified and entered using a nerve hook.  I then used #1 followed by #2 Kerrison to perform a thorough and complete intervertebral diskectomy.  The spinal canal was thoroughly decompressed, as was the right and left neuroforamen.  The endplates were then prepared and the appropriate-sized intervertebral spacer was then packed with ViviGen and tamped into position in the usual fashion.  The lower Caspar pin was then removed and placed into the C4 vertebral body and once again, distraction was applied across the C4-5 intervertebral space.  I then again performed a thorough and complete diskectomy, thoroughly decompressing the spinal canal and bilateral neuroforamena.  After preparing the endplates, the appropriate-sized intervertebral spacer was packed with ViviGen and tamped into position. The lower Caspar pin was then removed and placed into the C3 vertebral body and once again, distraction was applied across the C3/4 intervertebral space.  I then again performed a thorough and complete diskectomy, thoroughly decompressing the spinal canal and bilateral neuroforamena.  After preparing the endplates, the appropriate-sized intervertebral spacer was packed with ViviGen and tamped into position. The Caspar pins then were removed and bone wax was placed in their place.  The appropriate-sized anterior cervical plate was placed over the anterior spine.  [place]  mm variable angle screws were placed, 2 in each vertebral body from C3-C6 for a total of 8 vertebral body screws.  The screws were then locked to the plate using the Cam locking mechanism.  I was very pleased with the final fluoroscopic images.  The  wound was then irrigated.  The wound was then explored for any undue bleeding and there was no bleeding noted. The wound was then closed in layers using 2-0 Vicryl, followed by 4-0 Monocryl.  Benzoin and Steri-Strips were applied, followed by sterile dressing.  All instrument counts were correct at the termination of the procedure.  Of note, Pricilla Holm, PA-C, was my assistant throughout surgery, and did aid in retraction, suctioning, and closure from start to finish.     Phylliss Bob, MD

## 2018-01-21 NOTE — H&P (Signed)
PREOPERATIVE H&P  Chief Complaint: Deterioration in balance and fine motor skill  HPI: Amy Moyer is a 57 y.o. female who presents with ongoing symptoms as noted above  MRI reveals varying degrees of congenital spinal stenosis and NF narrowing spanning C3-C6  Patient has failed multiple forms of conservative care and continues to have pain (see office notes for additional details regarding the patient's full course of treatment)  Past Medical History:  Diagnosis Date  . Hemorrhoids   . History of hiatal hernia   . History of recurrent UTIs   . Hypertension   . Infected cyst of Bartholin's gland duct   . Sepsis affecting skin   . Uterine fibroid    Past Surgical History:  Procedure Laterality Date  . ABDOMINAL HYSTERECTOMY  2005  . APPENDECTOMY    . BARTHOLIN CYST MARSUPIALIZATION N/A 08/31/2015   Procedure: BARTHOLIN CYST MARSUPIALIZATION Left;  Surgeon: Emily Filbert, MD;  Location: [facility] [facility];  Service: Gynecology;  Laterality: N/A;  . CESAREAN SECTION    . DIAGNOSTIC LAPAROSCOPY     Social History   Socioeconomic History  . Marital status: Married    Spouse name: Not on file  . Number of children: Not on file  . Years of education: Not on file  . Highest education level: Not on file  Occupational History  . Occupation: Pharmacist, hospital  Social Needs  . Financial resource strain: Not on file  . Food insecurity:    Worry: Not on file    Inability: Not on file  . Transportation needs:    Medical: Not on file    Non-medical: Not on file  Tobacco Use  . Smoking status: Never Smoker  . Smokeless tobacco: Never Used  Substance and Sexual Activity  . Alcohol use: Yes    Alcohol/week: 0.0 oz    Comment: occassionaly  . Drug use: No  . Sexual activity: Yes    Partners: Male    Birth control/protection: None  Lifestyle  . Physical activity:    Days per week: Not on file    Minutes per session: Not on file  . Stress: Not on file  Relationships  . Social connections:      Talks on phone: Not on file    Gets together: Not on file    Attends religious service: Not on file    Active member of club or organization: Not on file    Attends meetings of clubs or organizations: Not on file    Relationship status: Not on file  Other Topics Concern  . Not on file  Social History Narrative  . Not on file   Family History  Problem Relation Age of Onset  . Hypertension Mother   . Diabetes Mother    Allergies  Allergen Reactions  . Ace Inhibitors Cough  . Sulfamethoxazole-Trimethoprim Other (See Comments)    Achy pain all over   Prior to Admission medications   Medication Sig Start Date End Date Taking? Authorizing Provider  losartan (COZAAR) 50 MG tablet Take 50 mg by mouth daily.   Yes [provider]  Naphazoline HCl (CLEAR EYES OP) Place 1 drop into both eyes daily as needed (for dry eyes).   Yes [provider]  sertraline (ZOLOFT) 50 MG tablet take 1 tablet by mouth daily Patient taking differently: Take 100 mg by mouth once daily 09/07/16  Yes Silverio Decamp, MD  ferrous sulfate 325 (65 FE) MG EC tablet Take 1 tablet (325 mg  total) by mouth daily with breakfast. Patient not taking: Reported on 03/25/2016 10/05/15   Silverio Decamp, MD  fluticasone [facility]) 50 MCG/ACT nasal spray One spray in each nostril twice a day, use left hand for right nostril, and right hand for left nostril. Patient not taking: Reported on 03/25/2016 09/03/15   Silverio Decamp, MD  furosemide (LASIX) 40 MG tablet Take 1 tablet (40 mg total) by mouth daily. Patient not taking: Reported on 03/25/2016 10/19/15   Silverio Decamp, MD  ibuprofen (ADVIL,MOTRIN) 600 MG tablet Take 1 tablet (600 mg total) by mouth every 8 (eight) hours as needed. Patient not taking: Reported on 01/13/2018 12/25/15   Silverio Decamp, MD  magnesium oxide (MAG-OX) 400 MG tablet Take 2 tablets (800 mg total) by mouth at bedtime. Patient not taking: Reported on  03/25/2016 01/16/15   Silverio Decamp, MD  Phendimetrazine Tartrate 105 MG CP24 One tab PO daily Patient not taking: Reported on 01/13/2018 06/12/16   Silverio Decamp, MD  potassium chloride SA (K-DUR,KLOR-CON) 20 MEQ tablet Take 2 tablets (40 mEq total) by mouth daily. Patient not taking: Reported on 03/25/2016 10/19/15   Silverio Decamp, MD  valsartan (DIOVAN) 160 MG tablet Take 1 tablet (160 mg total) by mouth daily. Patient not taking: Reported on 03/25/2016 11/27/15   Silverio Decamp, MD  vitamin B-12 (CYANOCOBALAMIN) 1000 MCG tablet Take 1 tablet (1,000 mcg total) by mouth daily. Patient not taking: Reported on 03/25/2016 10/05/15   Silverio Decamp, MD     All other systems have been reviewed and were otherwise negative with the exception of those mentioned in the HPI and as above.  Physical Exam: There were no vitals filed for this visit.  There is no height or weight on file to calculate BMI.  General: Alert, no acute distress Cardiovascular: No pedal edema Respiratory: No cyanosis, no use of accessory musculature Skin: No lesions in the area of chief complaint Neurologic: Sensation intact distally Psychiatric: Patient is competent for consent with normal mood and affect Lymphatic: No axillary or cervical lymphadenopathy   Assessment/Plan: PROGRESSIVE CERVICAL MYELOPATHY Plan for Procedure(s): ANTERIOR CERVICAL DECOMPRESSION FUSION CERVICAL 3-4, CERVICAL 4-5, CERVICAL5-6 WITH INSTRUMENTATION AND ALLOGRAFT   Sinclair Ship, MD 01/21/2018 6:38 AM

## 2018-01-22 DIAGNOSIS — M4802 Spinal stenosis, cervical region: Secondary | ICD-10-CM | POA: Diagnosis not present

## 2018-01-22 NOTE — Progress Notes (Signed)
Patient is dischargd from room 3C05 at this time. Alert and in stable condition. IV site d/c'd and instructions read to patient and spouse with understanding verbalized. Left unit via wheelchair with all belongings at side.

## 2018-01-22 NOTE — Progress Notes (Signed)
    Patient doing well  Has been eating and drinking Neck pain minimal No arm pain or back pain   Physical Exam: Vitals:   01/22/18 0326 01/22/18 0734  BP: 138/76 (!) 147/86  Pulse: (!) 54 60  Resp: 16 16  Temp: 98.4 F (36.9 C) 98.4 F (36.9 C)  SpO2: 97% 96%    Dressing in place NVI  POD #1 s/p C3-6 ACDF doing well with minimal pain and improved symptoms  - encourage ambulation - Percocet for pain, Valium for muscle spasms - d/c home today with f/u in 2 weeks

## 2018-01-22 NOTE — Plan of Care (Signed)
  Problem: Education: Goal: Knowledge of General Education information will improve Outcome: Progressing   Problem: Health Behavior/Discharge Planning: Goal: Ability to manage health-related needs will improve Outcome: Progressing   Problem: Clinical Measurements: Goal: Ability to maintain clinical measurements within normal limits will improve Outcome: Progressing Goal: Will remain free from infection Outcome: Progressing Goal: Diagnostic test results will improve Outcome: Progressing Goal: Respiratory complications will improve Outcome: Progressing Goal: Cardiovascular complication will be avoided Outcome: Progressing   Problem: Activity: Goal: Risk for activity intolerance will decrease Outcome: Progressing   Problem: Nutrition: Goal: Adequate nutrition will be maintained Outcome: Progressing   Problem: Coping: Goal: Level of anxiety will decrease Outcome: Progressing   Problem: Elimination: Goal: Will not experience complications related to bowel motility Outcome: Progressing Goal: Will not experience complications related to urinary retention Outcome: Progressing   Problem: Pain Managment: Goal: General experience of comfort will improve Outcome: Progressing   Problem: Safety: Goal: Ability to remain free from injury will improve Outcome: Progressing   Problem: Skin Integrity: Goal: Risk for impaired skin integrity will decrease Outcome: Progressing   Problem: Activity: Goal: Ability to avoid complications of mobility impairment will improve Outcome: Progressing Goal: Ability to tolerate increased activity will improve Outcome: Progressing Goal: Will remain free from falls Outcome: Progressing   Problem: Bowel/Gastric: Goal: Gastrointestinal status for postoperative course will improve Outcome: Progressing   Problem: Education: Goal: Ability to verbalize activity precautions or restrictions will improve Outcome: Progressing Goal: Knowledge of the  prescribed therapeutic regimen will improve Outcome: Progressing Goal: Understanding of discharge needs will improve Outcome: Progressing   Problem: Physical Regulation: Goal: Ability to maintain clinical measurements within normal limits will improve Outcome: Progressing Goal: Postoperative complications will be avoided or minimized Outcome: Progressing Goal: Diagnostic test results will improve Outcome: Progressing   Problem: Pain Management: Goal: Pain level will decrease Outcome: Progressing   Problem: Skin Integrity: Goal: Signs of wound healing will improve Outcome: Progressing   Problem: Health Behavior/Discharge Planning: Goal: Identification of resources available to assist in meeting health care needs will improve Outcome: Progressing   Problem: Bladder/Genitourinary: Goal: Urinary functional status for postoperative course will improve Outcome: Progressing

## 2018-01-25 ENCOUNTER — Encounter (HOSPITAL_COMMUNITY): Payer: Self-pay | Admitting: Orthopedic Surgery

## 2018-01-28 NOTE — Discharge Summary (Signed)
Patient ID: Amy Moyer MRN: [id number] DOB/AGE: November 23, 1960 57 y.o.  Admit date: 01/21/2018 Discharge date: 01/22/2018  Admission Diagnoses:  Active Problems:   Radiculopathy   Discharge Diagnoses:  Same  Past Medical History:  Diagnosis Date  . Hemorrhoids   . History of hiatal hernia   . History of recurrent UTIs   . Hypertension   . Infected cyst of Bartholin's gland duct   . Sepsis affecting skin   . Uterine fibroid     Surgeries: Procedure(s): ANTERIOR CERVICAL DECOMPRESSION FUSION CERVICAL THREE-FOUR, CERVICAL FOUR-FIVE , CERVICAL FIVE-SIX WITH INSTRUMENTATION AND ALLOGRAFT on 01/21/2018   Consultants: None  Discharged Condition: Improved  Hospital Course: Amy Moyer is an 57 y.o. female who was admitted 01/21/2018 for operative treatment of radiculopathy. Patient has severe unremitting pain that affects sleep, daily activities, and work/hobbies. After pre-op clearance the patient was taken to the operating room on 01/21/2018 and underwent  Procedure(s): ANTERIOR CERVICAL DECOMPRESSION FUSION CERVICAL THREE-FOUR, CERVICAL FOUR-FIVE , CERVICAL FIVE-SIX WITH INSTRUMENTATION AND ALLOGRAFT.    Patient was given perioperative antibiotics:  Anti-infectives (From admission, onward)   Start     Dose/Rate Route Frequency Ordered Stop   01/21/18 2000  ciprofloxacin (CIPRO) tablet 500 mg  Status:  Discontinued     500 mg Oral 2 times daily 01/21/18 1636 01/22/18 1411   01/21/18 1800  ceFAZolin (ANCEF) IVPB 2g/100 mL premix     2 g 200 mL/hr over 30 Minutes Intravenous Every 8 hours 01/21/18 1635 01/22/18 0910   01/21/18 0700  ceFAZolin (ANCEF) IVPB 2g/100 mL premix     2 g 200 mL/hr over 30 Minutes Intravenous On call to O.R. 01/21/18 0654 01/21/18 1142       Patient was given sequential compression devices, early ambulation to prevent DVT.  Patient benefited maximally from hospital stay and there were no complications.    Recent vital signs: BP (!) 147/86 (BP  Location: Right Arm)   Pulse 60   Temp 98.4 F (36.9 C) (Oral)   Resp 16   Ht 5\' 6"  (1.676 m)   Wt 79.8 kg (176 lb)   SpO2 96%   BMI 28.41 kg/m    Discharge Medications:   Allergies as of 01/22/2018      Reactions   Ace Inhibitors Cough   Sulfamethoxazole-trimethoprim Other (See Comments)   Achy pain all over      Medication List    TAKE these medications   ciprofloxacin 500 MG tablet Commonly known as:  CIPRO Take 500 mg by mouth 2 (two) times daily.   CLEAR EYES OP Place 1 drop into both eyes daily as needed (for dry eyes).   ferrous sulfate 325 (65 FE) MG EC tablet Take 1 tablet (325 mg total) by mouth daily with breakfast.   fluticasone 50 MCG/ACT nasal spray Commonly known as:  FLONASE One spray in each nostril twice a day, use left hand for right nostril, and right hand for left nostril.   furosemide 40 MG tablet Commonly known as:  LASIX Take 1 tablet (40 mg total) by mouth daily.   losartan 50 MG tablet Commonly known as:  COZAAR Take 50 mg by mouth daily.   magnesium oxide 400 MG tablet Commonly known as:  MAG-OX Take 2 tablets (800 mg total) by mouth at bedtime.   Phendimetrazine Tartrate 105 MG Cp24 One tab PO daily   potassium chloride SA 20 MEQ tablet Commonly known as:  K-DUR,KLOR-CON Take 2 tablets (40 mEq total) by mouth  daily.   sertraline 50 MG tablet Commonly known as:  ZOLOFT take 1 tablet by mouth daily What changed:    how much to take  how to take this  when to take this   valsartan 160 MG tablet Commonly known as:  DIOVAN Take 1 tablet (160 mg total) by mouth daily.   vitamin B-12 1000 MCG tablet Commonly known as:  CYANOCOBALAMIN Take 1 tablet (1,000 mcg total) by mouth daily.       Diagnostic Studies: Dg Chest 2 View  Result Date: 01/19/2018 CLINICAL DATA:  Preoperative examination prior to cervical fusion. History of hypertension, exertional shortness of breath, mild tricuspid regurgitation. Nonsmoker. EXAM:  CHEST - 2 VIEW COMPARISON:  None in PACs FINDINGS: The lungs are well-expanded and clear. The heart and pulmonary vascularity are normal. The mediastinum is normal in width. There is no pleural effusion. The bony thorax exhibits no acute abnormality. IMPRESSION: There is no active cardiopulmonary disease. Electronically Signed   By: David  Martinique M.D.   On: 01/19/2018 13:55   Dg Cervical Spine 1 View  Result Date: 01/21/2018 CLINICAL DATA:  Cervical fusion EXAM: DG CERVICAL SPINE - 1 VIEW; DG C-ARM 61-120 MIN COMPARISON:  None. FLUOROSCOPY TIME:  Radiation Exposure Index (as provided by the fluoroscopic device): Not available If the device does not provide the exposure index: Fluoroscopy Time:  13 seconds Number of Acquired Images:  1 FINDINGS: Changes consistent with cervical fusion at C3-4, C4-5 and C5-6 are noted with anterior fixation. IMPRESSION: Cervical fusion. Electronically Signed   By: Inez Catalina M.D.   On: 01/21/2018 14:23   Dg C-arm 1-60 Min  Result Date: 01/21/2018 CLINICAL DATA:  Cervical fusion EXAM: DG CERVICAL SPINE - 1 VIEW; DG C-ARM 61-120 MIN COMPARISON:  None. FLUOROSCOPY TIME:  Radiation Exposure Index (as provided by the fluoroscopic device): Not available If the device does not provide the exposure index: Fluoroscopy Time:  13 seconds Number of Acquired Images:  1 FINDINGS: Changes consistent with cervical fusion at C3-4, C4-5 and C5-6 are noted with anterior fixation. IMPRESSION: Cervical fusion. Electronically Signed   By: Inez Catalina M.D.   On: 01/21/2018 14:23    Disposition: Discharge disposition: 01-Home or Self Care       Discharge Instructions    Discharge patient   Complete by:  As directed    Discharge disposition:  01-Home or Self Care   Discharge patient date:  01/22/2018     POD #1 s/p C3-6 ACDF doing well with minimal pain and improved symptoms  - encourage ambulation - Percocet for pain, Valium for muscle spasms -Written scripts for pain signed  and in chart -D/C instructions sheet printed and in chart -D/C today  -F/U in office 2 weeks   Signed: Justice Britain 01/28/2018, 7:46 AM

## 2019-10-18 ENCOUNTER — Other Ambulatory Visit: Payer: Self-pay | Admitting: Orthopedic Surgery

## 2019-10-18 DIAGNOSIS — M533 Sacrococcygeal disorders, not elsewhere classified: Secondary | ICD-10-CM

## 2019-10-27 ENCOUNTER — Ambulatory Visit
Admission: RE | Admit: 2019-10-27 | Discharge: 2019-10-27 | Disposition: A | Discharge status: Home / Self Care | Payer: No Typology Code available for payment source | Source: Ambulatory Visit | Attending: Orthopedic Surgery | Admitting: Orthopedic Surgery

## 2019-10-27 ENCOUNTER — Other Ambulatory Visit: Payer: Self-pay

## 2019-10-27 DIAGNOSIS — M533 Sacrococcygeal disorders, not elsewhere classified: Secondary | ICD-10-CM

## 2020-02-07 IMAGING — RF DG C-ARM 61-120 MIN
1 series · 1 of 1 positions shown · non-contrast
Comparison: None.

CLINICAL DATA: Cervical fusion

EXAM:
DG CERVICAL SPINE - 1 VIEW; DG C-ARM 61-120 MIN

[Series 1: run · 1 of 1 slices shown]
[im 1/1]
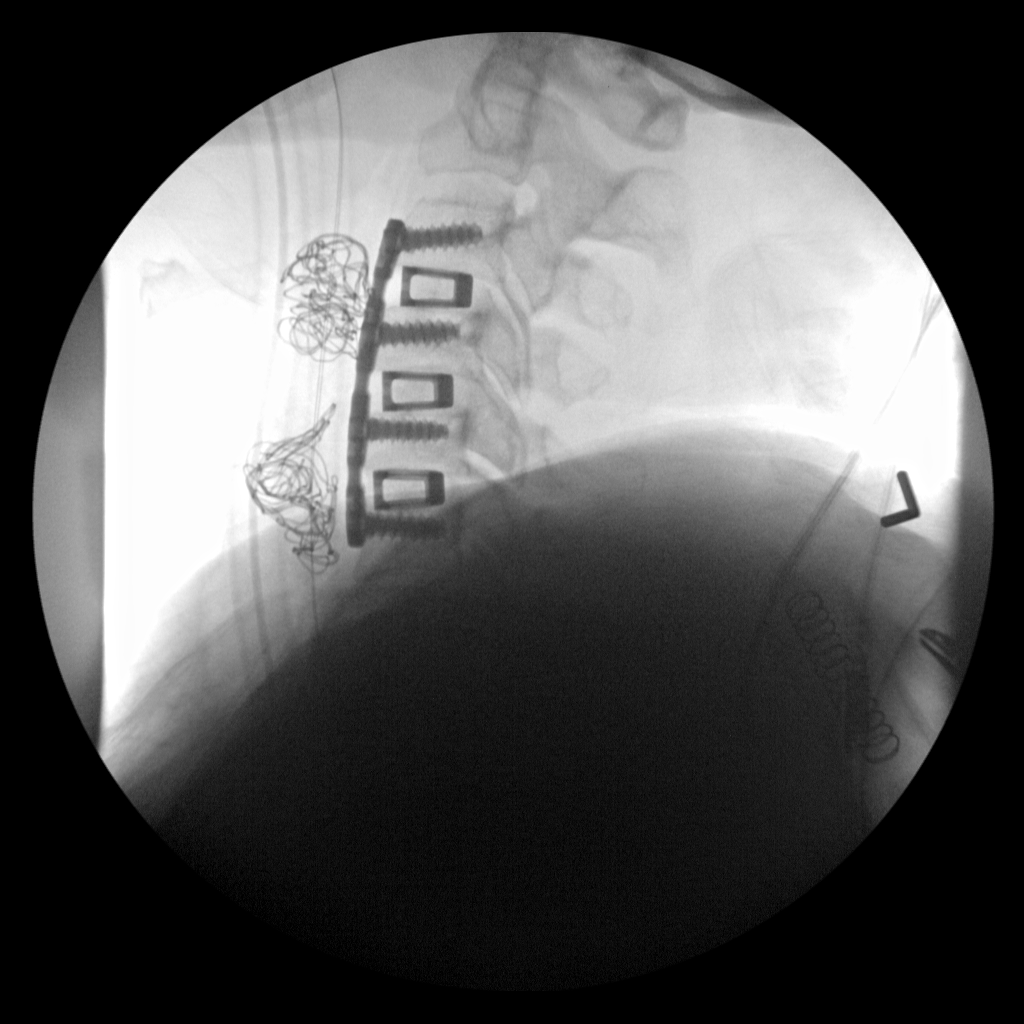

[1 of 1 positions shown; findings below may reference images not displayed]

FLUOROSCOPY TIME:  Radiation Exposure Index (as provided by the
fluoroscopic device): Not available

If the device does not provide the exposure index:

Fluoroscopy Time:  13 seconds

Number of Acquired Images:  1
FINDINGS: Changes consistent with cervical fusion at C3-4, C4-5 and C5-6 are
noted with anterior fixation.
IMPRESSION: Cervical fusion.
